# Patient Record
Sex: Male | Born: 1935 | Race: White | Hispanic: No | Marital: Married | Smoking: Former smoker
Health system: Southern US, Community
[De-identification: ages and names within clinical notes are randomized; demographics above are authoritative.]

## PROBLEM LIST (undated history)

## (undated) DIAGNOSIS — I719 Aortic aneurysm of unspecified site, without rupture: Secondary | ICD-10-CM

## (undated) DIAGNOSIS — I714 Abdominal aortic aneurysm, without rupture, unspecified: Secondary | ICD-10-CM

## (undated) DIAGNOSIS — F0390 Unspecified dementia without behavioral disturbance: Secondary | ICD-10-CM

## (undated) DIAGNOSIS — R05 Cough: Secondary | ICD-10-CM

## (undated) DIAGNOSIS — M1990 Unspecified osteoarthritis, unspecified site: Secondary | ICD-10-CM

## (undated) DIAGNOSIS — E611 Iron deficiency: Secondary | ICD-10-CM

## (undated) DIAGNOSIS — J939 Pneumothorax, unspecified: Secondary | ICD-10-CM

## (undated) DIAGNOSIS — J439 Emphysema, unspecified: Secondary | ICD-10-CM

## (undated) DIAGNOSIS — C801 Malignant (primary) neoplasm, unspecified: Secondary | ICD-10-CM

## (undated) DIAGNOSIS — Z9981 Dependence on supplemental oxygen: Secondary | ICD-10-CM

## (undated) DIAGNOSIS — H9190 Unspecified hearing loss, unspecified ear: Secondary | ICD-10-CM

## (undated) DIAGNOSIS — I739 Peripheral vascular disease, unspecified: Secondary | ICD-10-CM

## (undated) DIAGNOSIS — I499 Cardiac arrhythmia, unspecified: Secondary | ICD-10-CM

## (undated) DIAGNOSIS — E785 Hyperlipidemia, unspecified: Secondary | ICD-10-CM

## (undated) DIAGNOSIS — R059 Cough, unspecified: Secondary | ICD-10-CM

## (undated) DIAGNOSIS — K219 Gastro-esophageal reflux disease without esophagitis: Secondary | ICD-10-CM

## (undated) DIAGNOSIS — I2510 Atherosclerotic heart disease of native coronary artery without angina pectoris: Secondary | ICD-10-CM

## (undated) DIAGNOSIS — J449 Chronic obstructive pulmonary disease, unspecified: Secondary | ICD-10-CM

## (undated) DIAGNOSIS — E119 Type 2 diabetes mellitus without complications: Secondary | ICD-10-CM

## (undated) DIAGNOSIS — R0602 Shortness of breath: Secondary | ICD-10-CM

---
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
## 1967-03-03 HISTORY — PX: OTHER SURGICAL HISTORY: SHX169

## 1982-03-02 HISTORY — PX: OTHER SURGICAL HISTORY: SHX169

## 2000-01-28 ENCOUNTER — Other Ambulatory Visit: Admission: RE | Admit: 2000-01-28 | Discharge: 2000-01-28 | Payer: Self-pay | Admitting: Urology

## 2000-01-28 ENCOUNTER — Encounter (INDEPENDENT_AMBULATORY_CARE_PROVIDER_SITE_OTHER): Payer: Self-pay | Admitting: Specialist

## 2000-02-26 ENCOUNTER — Encounter (INDEPENDENT_AMBULATORY_CARE_PROVIDER_SITE_OTHER): Payer: Self-pay

## 2000-02-26 ENCOUNTER — Ambulatory Visit (HOSPITAL_COMMUNITY): Admission: RE | Admit: 2000-02-26 | Discharge: 2000-02-26 | Payer: Self-pay | Admitting: *Deleted

## 2002-04-14 ENCOUNTER — Encounter (INDEPENDENT_AMBULATORY_CARE_PROVIDER_SITE_OTHER): Payer: Self-pay | Admitting: Specialist

## 2002-04-14 ENCOUNTER — Ambulatory Visit (HOSPITAL_COMMUNITY): Admission: RE | Admit: 2002-04-14 | Discharge: 2002-04-14 | Payer: Self-pay | Admitting: *Deleted

## 2003-08-02 ENCOUNTER — Ambulatory Visit (HOSPITAL_COMMUNITY): Admission: RE | Admit: 2003-08-02 | Discharge: 2003-08-02 | Payer: Self-pay | Admitting: *Deleted

## 2003-08-02 ENCOUNTER — Encounter (INDEPENDENT_AMBULATORY_CARE_PROVIDER_SITE_OTHER): Payer: Self-pay | Admitting: *Deleted

## 2004-05-21 ENCOUNTER — Inpatient Hospital Stay (HOSPITAL_COMMUNITY): Admission: EM | Admit: 2004-05-21 | Discharge: 2004-05-23 | Payer: Self-pay | Admitting: Emergency Medicine

## 2004-05-22 ENCOUNTER — Encounter (INDEPENDENT_AMBULATORY_CARE_PROVIDER_SITE_OTHER): Payer: Self-pay | Admitting: Cardiovascular Disease

## 2004-07-20 ENCOUNTER — Inpatient Hospital Stay (HOSPITAL_COMMUNITY): Admission: EM | Admit: 2004-07-20 | Discharge: 2004-07-23 | Payer: Self-pay | Admitting: *Deleted

## 2005-03-02 HISTORY — PX: PROSTATECTOMY: SHX69

## 2005-10-19 ENCOUNTER — Encounter (INDEPENDENT_AMBULATORY_CARE_PROVIDER_SITE_OTHER): Payer: Self-pay | Admitting: Specialist

## 2005-10-20 ENCOUNTER — Ambulatory Visit (HOSPITAL_COMMUNITY): Admission: RE | Admit: 2005-10-20 | Discharge: 2005-10-20 | Payer: Self-pay | Admitting: *Deleted

## 2006-07-13 ENCOUNTER — Ambulatory Visit (HOSPITAL_COMMUNITY): Admission: RE | Admit: 2006-07-13 | Discharge: 2006-07-13 | Payer: Self-pay | Admitting: Internal Medicine

## 2006-11-23 ENCOUNTER — Encounter (INDEPENDENT_AMBULATORY_CARE_PROVIDER_SITE_OTHER): Payer: Self-pay | Admitting: *Deleted

## 2006-11-23 ENCOUNTER — Ambulatory Visit (HOSPITAL_COMMUNITY): Admission: RE | Admit: 2006-11-23 | Discharge: 2006-11-23 | Payer: Self-pay | Admitting: *Deleted

## 2006-11-24 ENCOUNTER — Ambulatory Visit: Payer: Self-pay | Admitting: Internal Medicine

## 2006-12-06 ENCOUNTER — Ambulatory Visit: Payer: Self-pay

## 2006-12-30 DIAGNOSIS — E785 Hyperlipidemia, unspecified: Secondary | ICD-10-CM

## 2006-12-30 DIAGNOSIS — Z87898 Personal history of other specified conditions: Secondary | ICD-10-CM

## 2006-12-30 DIAGNOSIS — R000 Tachycardia, unspecified: Secondary | ICD-10-CM

## 2006-12-30 DIAGNOSIS — J329 Chronic sinusitis, unspecified: Secondary | ICD-10-CM | POA: Insufficient documentation

## 2007-02-22 ENCOUNTER — Ambulatory Visit: Payer: Self-pay | Admitting: Internal Medicine

## 2007-03-24 ENCOUNTER — Ambulatory Visit: Payer: Self-pay | Admitting: Internal Medicine

## 2007-03-24 DIAGNOSIS — J441 Chronic obstructive pulmonary disease with (acute) exacerbation: Secondary | ICD-10-CM

## 2007-03-24 LAB — CONVERTED CEMR LAB: Digitoxin Lvl: 0.5 ng/mL — ABNORMAL LOW (ref 0.8–2.0)

## 2007-03-25 ENCOUNTER — Telehealth: Payer: Self-pay | Admitting: Internal Medicine

## 2007-04-06 ENCOUNTER — Telehealth: Payer: Self-pay | Admitting: Internal Medicine

## 2007-04-08 ENCOUNTER — Encounter: Payer: Self-pay | Admitting: Internal Medicine

## 2007-05-03 ENCOUNTER — Encounter: Payer: Self-pay | Admitting: Internal Medicine

## 2007-05-11 ENCOUNTER — Encounter: Payer: Self-pay | Admitting: Internal Medicine

## 2007-06-09 ENCOUNTER — Telehealth: Payer: Self-pay | Admitting: Internal Medicine

## 2007-06-23 ENCOUNTER — Ambulatory Visit: Payer: Self-pay | Admitting: Internal Medicine

## 2007-06-23 DIAGNOSIS — J984 Other disorders of lung: Secondary | ICD-10-CM | POA: Insufficient documentation

## 2007-06-23 DIAGNOSIS — J449 Chronic obstructive pulmonary disease, unspecified: Secondary | ICD-10-CM | POA: Insufficient documentation

## 2007-06-23 DIAGNOSIS — R1032 Left lower quadrant pain: Secondary | ICD-10-CM

## 2007-06-23 DIAGNOSIS — J439 Emphysema, unspecified: Secondary | ICD-10-CM

## 2007-06-23 DIAGNOSIS — J4489 Other specified chronic obstructive pulmonary disease: Secondary | ICD-10-CM | POA: Insufficient documentation

## 2007-07-08 ENCOUNTER — Telehealth (INDEPENDENT_AMBULATORY_CARE_PROVIDER_SITE_OTHER): Payer: Self-pay | Admitting: *Deleted

## 2007-07-20 ENCOUNTER — Encounter: Payer: Self-pay | Admitting: Internal Medicine

## 2007-07-22 ENCOUNTER — Encounter: Payer: Self-pay | Admitting: Internal Medicine

## 2007-07-29 ENCOUNTER — Telehealth (INDEPENDENT_AMBULATORY_CARE_PROVIDER_SITE_OTHER): Payer: Self-pay | Admitting: *Deleted

## 2007-08-03 ENCOUNTER — Telehealth (INDEPENDENT_AMBULATORY_CARE_PROVIDER_SITE_OTHER): Payer: Self-pay | Admitting: *Deleted

## 2007-08-03 ENCOUNTER — Ambulatory Visit: Payer: Self-pay | Admitting: Thoracic Surgery

## 2007-08-25 ENCOUNTER — Encounter: Payer: Self-pay | Admitting: Internal Medicine

## 2007-09-12 ENCOUNTER — Encounter: Payer: Self-pay | Admitting: Internal Medicine

## 2007-12-05 ENCOUNTER — Telehealth: Payer: Self-pay | Admitting: Internal Medicine

## 2009-03-02 DIAGNOSIS — C801 Malignant (primary) neoplasm, unspecified: Secondary | ICD-10-CM

## 2009-03-02 HISTORY — DX: Malignant (primary) neoplasm, unspecified: C80.1

## 2010-02-07 ENCOUNTER — Ambulatory Visit (HOSPITAL_COMMUNITY)
Admission: RE | Admit: 2010-02-07 | Discharge: 2010-02-07 | Payer: Self-pay | Source: Home / Self Care | Attending: Urology | Admitting: Urology

## 2010-03-23 ENCOUNTER — Encounter: Payer: Self-pay | Admitting: Internal Medicine

## 2010-06-13 ENCOUNTER — Ambulatory Visit (HOSPITAL_BASED_OUTPATIENT_CLINIC_OR_DEPARTMENT_OTHER)
Admission: RE | Admit: 2010-06-13 | Discharge: 2010-06-13 | Disposition: A | Discharge status: Home / Self Care | Payer: Medicare Other | Source: Ambulatory Visit | Attending: Urology | Admitting: Urology

## 2010-06-13 DIAGNOSIS — J4489 Other specified chronic obstructive pulmonary disease: Secondary | ICD-10-CM | POA: Insufficient documentation

## 2010-06-13 DIAGNOSIS — Z79899 Other long term (current) drug therapy: Secondary | ICD-10-CM | POA: Insufficient documentation

## 2010-06-13 DIAGNOSIS — Z01812 Encounter for preprocedural laboratory examination: Secondary | ICD-10-CM | POA: Insufficient documentation

## 2010-06-13 DIAGNOSIS — C61 Malignant neoplasm of prostate: Secondary | ICD-10-CM | POA: Insufficient documentation

## 2010-06-13 DIAGNOSIS — K219 Gastro-esophageal reflux disease without esophagitis: Secondary | ICD-10-CM | POA: Insufficient documentation

## 2010-06-13 DIAGNOSIS — J449 Chronic obstructive pulmonary disease, unspecified: Secondary | ICD-10-CM | POA: Insufficient documentation

## 2010-06-13 DIAGNOSIS — I10 Essential (primary) hypertension: Secondary | ICD-10-CM | POA: Insufficient documentation

## 2010-06-13 DIAGNOSIS — Z87891 Personal history of nicotine dependence: Secondary | ICD-10-CM | POA: Insufficient documentation

## 2010-06-19 NOTE — Op Note (Signed)
  NAME:  JACEY, PELC NO.:  [id number]  MEDICAL RECORD NO.:  [id number]          PATIENT TYPE:  OUT  LOCATION:  XRAY                         FACILITY:  [facility]  PHYSICIAN:  Maretta Bees. Vonita Moss, M.D.DATE OF BIRTH:  17-Mar-1928  DATE OF PROCEDURE:  06/13/2010 DATE OF DISCHARGE:  02/07/2010                              OPERATIVE REPORT   PREOPERATIVE DIAGNOSIS:  Prostatic carcinoma.  POSTOPERATIVE DIAGNOSIS:  Prostatic carcinoma.  PROCEDURE:  Cryoablation of prostate.  SURGEON:  Maretta Bees. Vonita Moss, M.D.  ANESTHESIA:  General.  INDICATIONS:  This 77 year old gentleman was found to have PSA of 9.52 and Gleason 7 carcinoma of the prostate.  He had a 57 g prostate and was given therapeutic options.  He elected cryotherapy and therefore we did downsizing with hormonal deprivation therapy.  Prostate came down to 33 g.  He was counseled about postoperative bleeding, retention or incontinence.  DESCRIPTION OF PROCEDURE:  The patient was brought to the operating room and placed in lithotomy position.  External genitalia and perineum prepped and draped in usual fashion.  Foley catheter was inserted, transrectal ultrasound probe was inserted.  Treatment planning was done with 2 needles in the top row, 2 needles in the second row, and 3 needles in the lower row.  He was cystoscoped and anterior urethra was normal.  He had small lateral lobes.  No obvious bladder lesions were seen.  A stiff guidewire was inserted and the urethral warming catheter inserted and seen to function fine on ultrasound.  He then underwent 2 freeze-thaw cycles with excellent results.  After urethral warming, a Foley catheter was reinserted and connected to close drainage and he was taken to recovery room in good condition having tolerated the procedure well.     Maretta Bees. Vonita Moss, M.D.     LJP/MEDQ  D:  06/13/2010  T:  06/13/2010  Job:  [id number]  Electronically Signed by Larey Dresser M.D.  on 06/19/2010 01:35:30 PM

## 2010-07-05 ENCOUNTER — Emergency Department (HOSPITAL_COMMUNITY)
Admission: EM | Admit: 2010-07-05 | Discharge: 2010-07-05 | Disposition: A | Discharge status: Home / Self Care | Payer: Medicare Other | Attending: Emergency Medicine | Admitting: Emergency Medicine

## 2010-07-05 DIAGNOSIS — N400 Enlarged prostate without lower urinary tract symptoms: Secondary | ICD-10-CM | POA: Insufficient documentation

## 2010-07-05 DIAGNOSIS — R339 Retention of urine, unspecified: Secondary | ICD-10-CM | POA: Insufficient documentation

## 2010-07-05 DIAGNOSIS — E7800 Pure hypercholesterolemia, unspecified: Secondary | ICD-10-CM | POA: Insufficient documentation

## 2010-07-15 NOTE — Assessment & Plan Note (Signed)
[facility]                             PULMONARY OFFICE NOTE   NAME:Fred Newton, Fred Newton                       MRN:          [id number]  DATE:11/24/2006                            DOB:          02-10-1929    CHIEF COMPLAINT:  Worsening shortness of breath for the past 6 months.   HISTORY OF PRESENT ILLNESS:  Fred Newton is a pleasant 77-year-  old male with chronic severe COPD.  He quit smoking in December, 2007.  After that he says he was doing great. However, since March of 2008  dyspnea on exertion has worsened progressively.  He feels as though he  hit a spot in March 2008 and since then has had a progressive  worsening of dyspnea which he describes as going down hill.  He denies  any specific COPD exacerbation or hospitalizations, emergency room  visits or any other change in symptoms at this particular time.  Overall  his sputum volume has decreased since he quit smoking, therefore he is  surprised by his worsening decline in dyspnea.  Currently he is dyspneic  from climbing one flight of stairs, and at the end of a shower.  He  thinks he can walk 3-4 blocks at a slow pace.   He denies any change in his chronic post nasal drip, any new chest pain,  orthopnea, nocturnal dyspnea, paroxysmal nocturnal dyspnea or  hemoptysis.  In terms of his sputum, in fact this is better, he brings  up occasional sputum which is gray or yellow in color.   PAST MEDICAL HISTORY:  1. Recent shingles in the left groin in May, 2008.  2. History of tachyarrhythmias not otherwise specified.  3. Negative cardiac stress-test in April of 2006.  4. Hyperlipidemia.  5. Chronic post nasal drip. Was on Claritin, currently on Zyrtec for      the last 6 months.  6. Negative CT angiogram for pulmonary embolism on Jul 21, 2004 at      [facility].  7. Left lower lobe nodule noted on CT scan on Jul 21, 2004 at [facility] but was negative on followup  on August, 2006 at      [facility].  8. Chronic obstructive pulmonary disease.  He was diagnosed with      emphysema in 1984 when he presented with pneumothorax.  He has had      no hospitalizations except one in May, 2006 for worsening dyspnea      when he stopped his Advair as part of a study.  He is not on any      home oxygen.  He denies any frequent exacerbations of      hospitalizations.  9. Exposed to tuberculosis when a child when his older sister spent      some time in a TB sanitarium.   PAST SURGICAL HISTORY:  1. Status post pleurodesis for a pneumothorax in 1984.  2. Bilateral [facility] Luc surgery for sinus polyps in 1969.   ALLERGIES:  No known drug allergies.   CURRENT  MEDICATIONS:  1. Fish oil 1000 mg b.i.d.  2. Calcium once daily.  3. Multivitamin once daily.  4. Aspirin 81 mg daily.  5. Wellbutrin once daily.  6. Prilosec 20 mg once daily.  7. Vitamin E 200 mg  once daily.  8. Digoxin 0.125 once daily.  9. Flomax 0.4 mg once daily.  10.Zocor 40 mg once daily.  11.Zyrtec once daily.  12.Vitamin C 500 mg once daily.  13.Advair 250/50 b.i.d.  14.Albuterol nebulizations as required.   SOCIAL HISTORY:  He lives with his wife.  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  He is  currently retired. He used to get exposed without aluminized fibers that  were intended for chafes. He says these fibers were very small and he  inhaled them.  He does not know if they have the potential to cause COPD  or lung disease because an OSHA visit to his site declared the facility  as okay.  He did smoke 1-1/2 packs a day x55 years but quit in  December, 2007 with the help of Chantix.  He has children.   FAMILY HISTORY:  His father had emphysema.  His mother had colon cancer  and sister has blood cancer.   REVIEW OF SYSTEMS:  Chronic post nasal drip for many years.  Was taking  Claritin, currently on Zyrtec.  Has leg  fatigue as well with exertion.  Also develops a sensation of fullness in the abdomen after eating.  Other than this, review of systems is positive for chronic dyspnea,  irregular heart beat as a younger person, some tooth problems, nasal  congestion and joint stiffness.  Rest of review of systems is negative  as documented in the questionnaire.   PHYSICAL EXAMINATION:  VITAL SIGNS:  Height 71 inches, weight 166.2  pounds. Temperature 97.8, blood pressure 120/76, pulse of 73, pulse  oximetry 97% on room air.  GENERAL:  Tall, thin male seated comfortably in the exam room.  CENTRAL NERVOUS SYSTEM:  Alert and oriented x3.  Speech and ambulation  are normal.  RESPIRATORY EXAM:  Air entry equal on both sides, prolonged expiration,  no specific wheeze.  CHEST EXAM:  Increased anteroposterior diameter.  HEENT EXAM:  Neck supple, no neck nodes, no elevated JVP.  CARDIOVASCULAR EXAM:  Normal heart sounds, regular rate and rhythm, no  murmurs, no gallops.  ABDOMEN:  Soft, no mass, no organomegaly, normal bowel sounds.  EXTREMITIES:  No clubbing, cyanosis, no pedal edema.  SKIN:  Is intact.  BACK EXAM:  No kyphosis, no scoliosis.   LABORATORY EVALUATION:  Cardiac stress-test done on June 05, 2004  Cardiolite at [facility] was negative according to patient.   CT scan of the chest done on Jul 21, 2004 at [facility] showed  no pulmonary embolism. There was a questionable nodule in the left mid  chest.  This was followed up at [facility] on October 17, 2004 with another CT scan with was reported as no new or persistent or  progressive nodules on the CT scan.  Both CT scans reported severe COPD  and bullous disease.  I personally have not viewed these films but have  looked at the reports.   Chest x-ray PA and lateral done on Jul 06, 2006 at [facility]  is reported to have severe COPD.  I have not viewed this film as well.   Spirometry and pulmonary  function tests.  First spirometry done on Jul 30, 2004 as part of a COPD study at [place].  FEV1 was 1.42  liters, 40% predicted.  FVC of 3.64 liters, 78% predicted and FE1, FVC  ratio of 39.  Another spirometry on April 10, 2004 shows an FE1 of 1.1  4 liters, 35% predicted.  Most recent spirometry was done at [facility] on Jul 13, 2006 around the time of his shingles in his groin.  His FE1 was 1.04 liters, 36 % predicted, FVC of 3.22 liters, 73%  predicted.  Therefore in 2 years he had an almost 400 cc age-and-smoking  related decline in absolute FEV1 but only a 4% decline in predicted  FEV1.   His laboratory evaluation on August 24, 2006:  Chemistries showed sodium  136, potassium 4.4, chloride 102, bicarb 24, BUN 25 and creatinine of  1.3.   Liver function tests were normal.   Today we performed a 6 minute walk test. Baseline blood pressure was  124/82, heart rate of 75, baseline dyspnea scale of 0.5, fatigue scale  of 0.5, pulse oximetry of 95%.  After 6 minutes he completed 400 meters.  At the end of the test his blood pressure was 130/84, pulse of 96,  dyspnea scale rose to 3, fatigue scale rose to 2, pulse oximetry dropped  to 87. He had no complaints at end of test   ASSESSMENT:  PROBLEM #1:  In summary, Fred Newton is a very pleasant 26-  year-old heavy smoker with gold stage III COPD.  He quite smoking in  December, 2007 but has had progressive dyspnea on exertion since March  of this year.  Correlating with this, is a loss of nearly 400 cc of FEV1  between May of 2006 and May of 2008.  On a 6 minute walk test, he  appeared quite functional covering 400 meters. Nothing in his history or  physical to suggest any other cause for worsening dyspnea other than age-  related decline in FEV1 and smoking related decline in FEV1.   PROBLEM #2:  Left lower lobe nodule.  The suspicion of left lower lobe  nodule in a CT scan of the chest in May of 2006 but was not reported  in  August, 2006.  The scans were done at different hospitals.  I have not  looked at these scans.  At his next visit I will personally look at  these scans and confirm absence of nodule.   PROBLEM #3:  Smoking.  He has quit smoking and I have congratulated him  on this fact.   PLAN:  1. I would still rule out anginal equivalent given high association of      CAD with COPD. I have recommended another nuclear stress test      becuase last one was 2 years ago   1. Start Spiriva daily inhaler for his chronic obstructive pulmonary      disease.   3 Flu shot today.  Inquire about pneumovax at next visit.   FOLLOW UP:  I will refer him to pulmonary rehabilitation and also check  alpha 1 antitrypsin level given family history of COPD.     Kalman Shan, MD  Electronically Signed    MR/MedQ  DD: 11/29/2006  DT: 11/29/2006  Job #: [id number]

## 2010-07-15 NOTE — Op Note (Signed)
NAME:  Fred Newton, PLESS NO.:  [id number]   MEDICAL RECORD NO.:  [id number]          PATIENT TYPE:  AMB   LOCATION:  ENDO                         FACILITY:  [facility]   PHYSICIAN:  Georgiana Spinner, M.D.    DATE OF BIRTH:  1929-01-21   DATE OF PROCEDURE:  11/23/2006  DATE OF DISCHARGE:                               OPERATIVE REPORT   PROCEDURE:  Upper endoscopy.   INDICATIONS:  Gastroesophageal reflux.   ANESTHESIA:  Fentanyl 75 mcg, Versed 5 mg.   DESCRIPTION OF PROCEDURE:  With the patient mildly sedated in the left  lateral decubitus position, the Pentax videoscopic endoscope was  inserted and passed under direct vision through the esophagus which  appeared normal until we reached distal esophagus and there were changes  of Barrett's esophagus photographed and biopsied.  We entered into the  stomach.  The fundus, body, antrum, duodenal bulb, and second portion of  the duodenum all appeared normal.  From this point, the endoscope was  slowly withdrawn taking circumferential views of the duodenal mucosa  until the endoscope had been pulled back into the stomach and placed in  retroflexion to view the stomach from below. The endoscope was  straightened and withdrawn taking circumferential views of the remaining  gastric and esophageal mucosa.  The patient's vital signs and pulse  oximeter remained stable.  The patient tolerated the procedure well  without apparent complication.   FINDINGS:  Barrett's esophagus biopsied, await biopsy report.  The  patient will call me for results and follow up with me as an outpatient  as necessary.           ______________________________  Georgiana Spinner, M.D.     GMO/MEDQ  D:  11/23/2006  T:  11/23/2006  Job:  [id number]   cc:   Lenon Curt. Chilton Si, M.D.  Fax: [phone]

## 2010-07-15 NOTE — Letter (Signed)
August 03, 2007   Fred Shan, MD  [phone] [address]  [place], [place]  [address]   Re:  Fred Newton, Fred Newton                DOB:  [birth year]   Dear Carmin Muskrat,   I appreciate the opportunity to see this gentleman, a 77 year old  Caucasian male, who has a long history of smoking and developed severe  emphysema.  In 1984, Dr Rica Mast in [place] did a bullectomy on the right.  He now comes in today for evaluation of a bulla on the left side.  The  CT scan shows a large left apical bulla, but there is no compression of  the remaining lung.  In fact, the remaining lung looks more like a  vanishing lung syndrome in that there is very little functional lung  available on the left side.  He does have some bullae on the right side,  but his lungs appeared to be better.  His FEV1 was 1.22 or 40% of  predicted.  He does have home oxygen and has a fairly decent performance  on a 6-minute walk test.  His medications include aspirin, digoxin 0.125  daily, Prilosec 20 mg a day, Zyrtec 10 mg day, Flomax 0.4 mg day,  simvastatin 40 mg a day, Advair Diskus 250/50 one puff twice a day, and  Proventil inhaler, Spiriva 18 mcg 2 puffs twice a day, and albuterol  inhaler.  He has had some previous prednisone and antibiotics.  On  occasions, he is taking Reglan and multiple vitamins.   ALLERGIES:  He is allergic to PREDNISONE, which causes him to be very  talkative and hyperactive.   He has been treated for hypercholesterolemia and chronic obstructive  pulmonary disease.  He quit smoking in 2007.   FAMILY HISTORY:  Noncontributory.   SOCIAL HISTORY:  He is retired, has 1 child.  Quit smoking in December  2007.  Does not drink alcohol on a regular basis.   REVIEW OF SYSTEMS:  He is 170 pounds.  He is 6 feet 1.  GENERAL:  There is no weight loss.  CARDIAC:  No angina.  He has shortness of breath with exertion, and he  has had some atrial arrhythmias.  No angina.  PULMONARY:  No hemoptysis.  GI:  No nausea,  vomiting, constipation, or diarrhea.  GU:  No dysuria or frequent urination or vascular disease, but does have  some hesitancy.  VASCULAR:  No claudication, DVT, or TIAs.  NEUROLOGICAL:  No dizziness, headaches, blackouts, or seizures.  MUSCULOSKELETAL:  No arthritis.  EYES/ENT:  He has cataracts.  No changes in hearing.  HEMATOLOGIC:  No problems with bleeding or clotting disorders or anemia.  He has no psychiatric illnesses.   PHYSICAL EXAMINATION:  His blood pressure is 118/78, pulse 78,  respirations 18, and O2 sat 94% on oxygen.  Head, Eyes, Ears, Nose, and  Throat:  Unremarkable.  Neck:  Supple without thyromegaly.  There is no  supraclavicular or axillary adenopathy.  Chest:  There is decreased  breath sounds and just clearly distant breath sounds on the left side,  but better breath sounds on the right side.  No wheezes.  He has a right  thoracotomy scar.  Heart:  Regular sinus rhythm.  No murmurs.  Abdomen:  Soft.  No hepatosplenomegaly.  Pulses are 2+.  There is no clubbing or  edema.   I have discussed situation with the patient, and I do not  believe he has  enough lung to warrant a bullectomy on the left side.  I think  his only  option would be to referral to [facility] for further evaluation for possible  lung transplantation.  I just do not feel there is enough viable lung to  undergo any type of surgery on the left side.  So, my recommendation  will be to send him down to the [facility] for another  opinion.  We are happy to see him again if I can be of any further help.   Ines Bloomer, M.D.  Electronically Signed   DPB/MEDQ  D:  08/03/2007  T:  08/04/2007  Job:  [id number]

## 2010-07-18 NOTE — Op Note (Signed)
NAME:  Fred Newton, Fred Newton NO.:  [id number]   MEDICAL RECORD NO.:  [id number]          PATIENT TYPE:  AMB   LOCATION:  ENDO                         FACILITY:  [facility]   PHYSICIAN:  Georgiana Spinner, M.D.    DATE OF BIRTH:  11-09-1928   DATE OF PROCEDURE:  10/20/2005  DATE OF DISCHARGE:                                 OPERATIVE REPORT   PROCEDURE:  Upper endoscopy.   INDICATIONS:  Barrett's esophagus.   ANESTHESIA:  Fentanyl 75 mcg and Versed 5 mg.   PROCEDURE:  With the patient was mildly sedated in the left lateral  decubitus position, the Olympus video endoscope was inserted into the mouth  and passed under direct vision through the esophagus.  It appeared normal  until we reached the distal esophagus and there were changes of Barrett's.  This was photographed and biopsied.  We entered into the stomach where  hiatal hernia, fundus, body, antrum, duodenal bulb and second portion of the  duodenum were visualized.  From this point the endoscope was slowly  withdrawn taking several views of the duodenum and mucosa until the  endoscope had been pulled back into the stomach, placed in retroflex and  viewed the stomach from below.  The endoscope was straightened and withdrawn  taking several views of the gastric and esophageal mucosa.  The patient's  vital signs on pulse oximeter remained stable.  The patient tolerated the  procedure well without any apparent complications.   FINDINGS:  1. Barrett's esophagus.  2. Hiatal hernia.   PLAN:  The patient will call me for results and follow up with me as an  outpatient.           ______________________________  Georgiana Spinner, M.D.     GMO/MEDQ  D:  10/20/2005  T:  10/20/2005  Job:  [id number]   cc:   Lenon Curt. Chilton Si, M.D.

## 2010-07-18 NOTE — H&P (Signed)
NAME:  Fred Newton, Fred Newton NO.:  [id number]   MEDICAL RECORD NO.:  [id number]          PATIENT TYPE:  INP   LOCATION:  1827                         FACILITY:  [facility]   PHYSICIAN:  Altha Harm, MDDATE OF BIRTH:  [birth year]   DATE OF ADMISSION:  07/20/2004  DATE OF DISCHARGE:                                HISTORY & PHYSICAL   CHIEF COMPLAINT:  Shortness of breath x 2 weeks, getting progressively worse  over the last 24 hours.   HISTORY OF PRESENT ILLNESS:  This is a 77 year old gentleman with a history  of COPD who presents today with worsening shortness of breath over the last  two weeks.  According to the patient, he was seen by his primary doctor, Dr.  Chilton Si, in the office last week and without any improvement in his symptoms.  He states that, over the last 24 hours, he has noted that he was extremely  short of breath and did not respond to albuterol MDIs.  The patient does not  have a nebulizer at home.  Incidentally, the patient has been in an acid  study at [facility] at the __________ clinic which has had the patient off  Advair for approximately a one-month period.  The patient states that he  does not short the study drug but states that since he has been off Advair,  he has been having shortness of breath intermittently.  The patient denies  any fever or chills, nausea, vomiting, or diarrhea.  His cough has been  nonproductive.  He denies any other associated symptoms.   PAST MEDICAL HISTORY:  1.  COPD.  2.  Tachyarrhythmia.  3.  Benign prostatic hypertrophy.   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  The patient states that he smoked a  pack per day for approximately 55 years; however, he has been cutting down  on cigarette smoking over the last month.  The patient denies any  alcohol or  drug use.  The patient notes that he has been using nicotine patches and  Nicoderm CQ as part of smoking cessation program.   ALLERGIES:  No known drug allergies.   CURRENT MEDICATIONS:  1.  Flomax 0.4 mg daily.  2.  Clarinex 10 mg daily.  3.  Aspirin 81 mg daily.  4.  Digoxin 0.125 mg daily.  5.  Vitamin C 500 mg daily.  6.  Vitamin E 200 international units daily.  7.  Albuterol MDI q.4h. p.r.n.  8.  The patient had been on Advair 250/50 mg but is off it right now.  9.  The patient also uses a nicotine patch 21 mg daily.   REVIEW OF SYSTEMS:  The patient denies any dizziness.  He denies any unusual  weight loss or weight gain.  He denies any heat or cold intolerance.  He  denies any fatigue.  He denies any loss of consciousness or seizure today.  The  patient denies any chest pain, any palpitations, any claudication.  The  patient denies any abdominal pain, constipation, diarrhea, or reflux  symptoms.  The patient denies any frequency, urgency, dysuria.  The patient  denies any joint pain and denies any focal weakness.  All other systems  negative except as noted in HPI.   LABORATORY DATA:  Laboratory studies in the ER showed a sodium of 134,  potassium 4.8, chloride 101, bicarb 30.4, BUN 9, creatinine 0.9.  Venous  blood gas showed a pH of 7.33, PCO2 of 56.4.  Digoxin level 0.7.  CBC showed  a white blood cell count of 10.3, hemoglobin 13.9, and platelets 296.   PHYSICAL EXAMINATION:  GENERAL:  The patient is resting comfortably in no  acute distress.  The patient is having coughing spells, but between those  spells, he is without any discharge.  The patient has no conversation  dyspnea.  VITAL SIGNS:  Temperature 97.8, blood pressure 137/88, heart rate 94,  respiratory rate 24.  The patient is 93% on room air; however, with  ambulation, O2 saturation dropped to 82%.  HEENT:  The patient is normocephalic and atraumatic.  Pupils equal, round,  and reactive to light  and accommodation.  Extraocular movements are intact.  Fundi benign.  Tympanic membranes are translucent bilaterally with good  landmarks.  Buccal mucosa is pale.  There are no polyps noted.  Oropharynx:  Mucosa is moist.  There are no exudates, erythema, or lesions.  NECK:  Supple.  Trachea is midline.  No masses, no thyromegaly noted.  There  was no JVD, no carotid bruits.  LUNGS:  The patient has no conversation dyspnea.  He does have some mild  nasal flaring; however, no other accessory muscle use.  Equal excursion  bilaterally.  Tympanic to auscultation.  There is no decreased vocal  fremitus.  There is mild diffuse wheezing.  No rales noted.  CARDIAC:  PMI is nondisplaced.  He has a normal S1 and S2.  No murmurs,  rubs, or gallops are noted.  There is no heaves or thrills with palpation.  ABDOMEN:  Soft, nontender, nondistended.  No masses and no  hepatosplenomegaly noted.  He has normoactive bowel sounds.  LYMPHADENOPATHY:  There is no cervical, inguinal, or axillary  lymphadenopathy noted.  MUSCULOSKELETAL:  The patient has full range of motion in joints of upper  and lower extremities.  Gait is within normal limits.  There is no warmth,  erythema, or swelling around the joints.  NEUROLOGIC:  The patient's cranial nerves II-XII grossly intact.  There is  no focal neurological deficits.  Sensation is intact to light touch and  pinprick, proprioception.  Strength is 4/5 bilateral upper and lower  extremities.  Psychologically, the patient has a normal affect, good  insight, good recent and remote recall.   ASSESSMENT AND PLAN:  A patient with acute exacerbation of chronic  obstructive pulmonary disease, most likely secondary to a viral process in  addition to the fact that he is off his control medication. We will bring  the patient in as an admission and try to wean the oxygen down to room air  respiration.  The patient will also be started on oral prednisone at a dose of 60 mg  p.o. daily and will give him albuterol and Atrovent treatments.  The patient will be continued on nicotine patch and will be restarted on  Advair 250/50 one puff p.o. per day.  I anticipate that the patient should  be  ready for discharge in approximately 72 hours.  In addition, the patient  will be started on Zithromax for atypical process.   In terms of tachyarrhythmia, the patient will be continued on digoxin at his  current dose.  Even though the medication is still subtherapeutic, the  patient appears to be having no symptoms.  Will need to increase the dosage  of digoxin.   For stroke prevention, the patient will be placed on aspirin.   For smoking cessation, the patient will be placed on nicotine 21 mg  transdermally.  The patient will get a regular diet.  The patient will be  given Mucinex for cough and expectoration.      MAM/MEDQ  D:  07/20/2004  T:  07/20/2004  Job:  [id number]

## 2010-07-18 NOTE — Consult Note (Signed)
NAME:  Fred Newton NO.:  [id number]   MEDICAL RECORD NO.:  [id number]          PATIENT TYPE:  INP   LOCATION:  1830                         FACILITY:  [facility]   PHYSICIAN:  Ulyses Amor, MD DATE OF BIRTH:  Mar 03, 1928   DATE OF CONSULTATION:  DATE OF DISCHARGE:                                   CONSULTATION   CONSULTING PHYSICIAN:  Ulyses Amor, M.D.   Fred Newton is a 77 year old white man who is admitted to [facility] for hypotension.  A cardiology consultation was requested in order  to assist in his management.   The patient initially presented to his primary care physician today  complaining of dizziness.  His wife took his blood pressure this morning and  said his systolic blood pressure ranged from 70 to 80.  His systolic blood  pressure usually in the range of 100.  His lightheadedness was worse in the  standing position.  He also noted a mild sharp pain in his left upper  anterior chest with deep inspiration.  The patient has a history of severe  chronic obstructive pulmonary disease.  Approximately one month ago he was  treated with antibiotics for a pneumonia.  He also received a prednisone  taper for an exacerbation of his COPD.  He reports no fever, chills, cough,  or sputum production, nor has he experienced any chest pain, tightness,  heaviness, pressure, or squeezing.  Upon presentation to the emergency room,  he was found to be in atrial fibrillation with a ventricular rate of 150  beats per minute.  His systolic blood pressure was approximately 70.  Diltiazem was started.  He soon converted to a normal sinus rhythm in the  range of 70-80.  His blood pressure has remained in the 80s.  He remained  alert and __________  well.  The patient has no past history of cardiac  disease including no history of chest pain, myocardial infarction, coronary  artery disease, or congestive heart failure.  The patient continues to  smoke.   He also has a history of dyslipidemia.  There is no history of  hypertension, diabetes mellitus, or family history of coronary artery  disease.   The patient has a number of other medical problems:  1.  He has a past history of colon cancer.  2.  There is a history of benign prostatic hypertrophy.  3.  Diverticulosis.  4.  Dupuytren contractures.  5.  Lumbar spondylosis.  6.  Barrett esophagus.   PREVIOUS OPERATIONS:  1.  Right thoracotomy with __________  resection in 1984.  2.  Eyelid surgery in 2005.  3.  Vasectomy in 1969.   MEDICATIONS:  Advair, albuterol, Claritin, Prilosec, QVAR, Flomax.   ALLERGIES:  None.   The patient is retired.  He lives with his wife.  He smokes as noted above.  He does not drink.   REVIEW OF SYSTEMS:  Reveals no problems related to his head, eyes, ears,  nose, mouth, throat, lungs, gastrointestinal system, genitourinary system,  or extremities.  There is no history of neurologic  or psychiatric disorder.  There is no history of fever, chills, or weight loss.   PHYSICAL EXAMINATION:  VITAL SIGNS:  Blood pressure 96/75, pulse 87 and  regular, respirations 20, temperature 97.0.  GENERAL:  The patient was an elderly white man in no discomfort.  He was  alert, oriented, and appropriate.  HEENT:  Head, eyes, nose, and mouth were normal.  NECK:  Without thyromegaly or adenopathy.  Carotid pulses were palpable  bilaterally and without bruits.  CARDIAC:  Revealed a normal S1 and S2.  There was no S3, S4, murmur, rub, or  click.  Cardiac rhythm is regular.  No chest wall tenderness was noted.  LUNGS:  Clear.  ABDOMEN:  Without hepatosplenomegaly, tenderness, bruit, distention,  rebound, guarding, or rigidity.  Bowel sounds were normal.  RECTAL:  Not performed, not pertinent to the reason for acute care  hospitalization.  GENITAL:  Not performed, not pertinent to the reason for acute care  hospitalization.  EXTREMITIES:  Without edema, deviation, or  deformity.  Radial and dorsalis  pedal pulses were palpable bilaterally.  NEUROLOGIC:  Brief screening neurologic survey was unremarkable.   A chest radiograph reportedly demonstrated a 12 x 10-cm left upper lobe  bullous and a 7 x 5-cm left lower lobe bullous.  There was a peribronchial  infiltrate consistent with pneumonia.  The initial electrocardiogram  demonstrated atrial flutter.  The post conversion electrocardiogram was  normal with the exception of right atrial enlargement.  The white count was  12.7 with a hemoglobin of 14.0, and hematocrit of 39.0.  Potassium is 4.6,  BUN 17, creatinine 1.7.  Pulse ox was 98% on room air.  The remaining  studies were pending at the time of this dictation.   IMPRESSION:  1.  Hypertension.  This is probably due to volume depletion, would rule out      sepsis.  The patient's blood pressure usually runs in the range of 100,      so his blood pressure now of 85 to 95 is not markedly depressed.  Doubt      that there is a cardiac etiology for the patient's hypotension.  2.  Atrial flutter, resolved.  He had no history of cardiac disease and      there was no chest pain suggestive of cardiac ischemia.  His post      conversion electrocardiogram is normal with the exception of right      atrial enlargement.   RECOMMENDATIONS:  1.  Cardiac step down unit.  2.  Serial cardiac enzymes.  3.  Echocardiogram.  4.  Intravenous fluids.  5.  Blood cultures.  6.  Sepsis antibiotic coverage.  7.  Pulmonary consult.  8.  Dr. Allyson Sabal will follow with you beginning in the morning.      MSC/MEDQ  D:  05/21/2004  T:  05/22/2004  Job:  [id number]   cc:   Ulyses Amor, MD  [address]  [place], [place] [address]  Fax: [phone]   Nanetta Batty, M.D.  Fax: [phone]

## 2010-07-18 NOTE — Discharge Summary (Signed)
NAME:  Fred Newton, Fred Newton NO.:  [id number]   MEDICAL RECORD NO.:  [id number]          PATIENT TYPE:  INP   LOCATION:  3705                         FACILITY:  [facility]   PHYSICIAN:  Everett Graff, N.P.  DATE OF BIRTH:  [birth year]   DATE OF ADMISSION:  05/21/2004  DATE OF DISCHARGE:  05/24/2004                                 DISCHARGE SUMMARY   CHIEF COMPLAINT:  Feeling faint.   DISCHARGE DIAGNOSES:  1.  Dizziness and faintness, presenting complaint.  Thought likely      multifactorial due to hypotension from mild dehydration and atrial      flutter.  All symptomatic complaints resolved.      1.  ? component of addisonian crisis post stress dose steroids described          above.      2.  Cardiology consult workup by [facility] and Vascular as          described below.      3.  A two-view chest x-ray on May 23, 2004, notes severe bullous          emphysema, but no active disease.  2.  Atrial flutter and hypotension.  Again thought likely multifactorial due      to dehydration plus or minus addisonian crisis.      1.  Cardiac enzymes x 3 sets essentially unremarkable other than a          mildly elevated troponin as outlined below.      2.  Cardiology consult by Dr. Effie Shy and then Dr. Clarene Duke of          [facility] and Vascular felt this presentation did not          represent an acute cardiac event with recommended outpatient          Cardiolite study to be performed at the [facility] and          Vascular office per appointment on June 05, 2004, at 10:30 a.m.          Also, followup appointment with Dr. Clarene Duke on May 16, 2004, at          3:30 p.m.      3.  2-D echocardiogram done on May 22, 2004, notes overall left          ventricular systolic function normal with ejection fraction          estimated at 55-65%.  No evidence of left ventricular regional wall          motion abnormalities.  Aortic valve thickness moderately  increased.          Low-normal aortic valve leaflet excursion.  Poor acoustic window          with a difficult assessment of aortic valve.  Thickness in the          region of the left and right cusps.  Does not appear hemodynamically          significant for aortic stenosis, but consider TEE for further  evaluation if felt clinically indicated.  Flat mitral valve closure          without definitive evidence of mitral valve prolapse.      4.  Per post admission telemetry, the patient's normal sinus rhythm with          rate 50-90 range.  Presenting 12-lead EKG did note atrial flutter at          a rate of 150 per minute.      5.  Digoxin 0.25 mg daily initiated per cardiology.  Requests followup          serum digoxin level next office visit at [facility] in one          to two weeks.  3.  Question addisonian crisis.      1.  Mild hypotension, tachycardia, hyponatremia and high-normal serum          potassium all suggestive of possible addisonian crisis.      2.  The patient's random serum cortisol level was 4.8.  Serum TSH 0.7-          0.8.      3.  Status post IV steroid stress dose with conversion at discharge to          oral prednisone with rapid taper to discontinuance post discharge.  4.  Pleuritic chest pain.      1.  Chest x-ray again indicated bullous emphysema with no active          disease.      2.  The plan was for CT scan to rule out pulmonary emboli, although the          patient is not currently reporting chest pain and is requesting          discharge.  CT scan deferred.  5.  Dehydration with hyponatremia.      1.  Baseline BUN and creatinine about 15 and 1.0, respectively.  On          admission, low sodium at 129, BUN 17 and creatinine 1.7.      2.  At discharge, sodium remains low at 127.  Request recheck of basic          metabolic panel at [facility] office on May 26, 2004, to          follow up.  6.  Normocytic anemia.  Hemoglobin  admission level 14.0 and at discharge      10.6.      1.  No sign of acute bleed.  Felt likely dilutional component secondary          to IV fluids.      2.  Anemia not worked up over the course of this hospitalization.  A          repeat CBC at [facility] office on May 26, 2004, and          follow up of anemia as felt appropriate post discharge.      3.  Previous history of diverticulosis, external hemorrhoids and          Barrett's esophagus.  7.  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      1.  Respiratory status stable over the course of hospitalization with          discontinuing short-term antibiotic therapy, Rocephin and Avelox  prior to discharge.      2.  For office followup at [facility] one to two weeks post          discharge.  8.  History of colon cancer felt in remission.  9.  History of hyperlipidemia.  10. History of benign prostatic hypertrophy with elevated prostatic specific      antigen followed by Dr. Vonita Moss, urology.  11. History of tinnitus.  12. History of diffuse lumbar spondylosis.  13. Surgical history includes right thoracotomy in 1984, eyelid surgery in      April of 2005 and vasectomy in 1969.   DISCHARGE MEDICATIONS:  1.  Albuterol metered dose inhaler p.r.n. as previous.  2.  Claritin D 5/240 mg daily.  3.  Enteric-coated aspirin 81 mg daily.  4.  Multivitamins daily.  5.  Vitamin C 500 mg daily.  6.  Vitamin E one tablet daily.  7.  Prilosec 40 mg daily.  8.  Viagra 100 mg as needed as previous.  9.  Qvar 80 mcg inhaler as directed per pharmacologic research study.  10. Flomax 0.4 mg daily.  11. Digoxin 0.25 mg daily (new medication).  12. Guaifenesin 600 mg two tablets every 12 hours.  Dosed to last two weeks'      duration post discharge.  13. Prednisone taper 60 mg daily for two days, then 40 mg daily for four     days, then 20 mg daily for two days, then 10  mg daily for two days and      then discontinue.  14. Tylenol 650 mg every four hours as needed for mild pain or fever.   SPECIAL DISCHARGE INSTRUCTIONS:  1.  Disposition:  Discharged home where the patient lives independently with      his wife.  2.  Activity:  Up as tolerated without restriction.  3.  Discharge diet:  Regular without restriction.  4.  Follow up at [facility] and Vascular for Cardiolite study on      June 05, 2004, at 10:30 a.m.  5.  Follow up with Dr. Clarene Duke at [facility] and Vascular on June 16, 2004, at 3:30 p.m.  6.  Followup laboratory work at [facility] on May 26, 2004, for basic metabolic panel and CBC without differential.  7.  Followup appointment at [facility] office in one to two weeks.      Call [phone] for an appointment.  8.  During the next office visit, the patient should have a check of his      digoxin level.   CONSULTS OVER THE COURSE OF HOSPITALIZATION:  [facility] and  V[facility].   PROCEDURES OVER THE COURSE OF HOSPITALIZATION:  A 2-D echocardiogram on  May 22, 2004, with results as above.   DISCHARGE LABORATORIES:  A basic metabolic panel on May 23, 2004, found  sodium 127, potassium 4.6, chloride 100, CO2 24, BUN 15 and creatinine 1.0.  Liver function normal.  Albumin low at 2.7.  CBC on May 23, 2004, found  WBC elevated at 11.7 on stress dose steroids, hemoglobin 10.6, hematocrit  29.7 and platelets 234.  Cardiac enzymes x 3 sets essentially unremarkable,  except for mildly elevated troponin ranging 0.29-0.13.  TSH normal a 0.708.  Cortisol random 4.8.  Urine unremarkable.  Blood cultures x 2 with no  growth.  Chest x-ray on May 23, 2004, noted severe bullous emphysema with  no active disease.   HISTORY OF PRESENT ILLNESS:  This 77 year old male lives at home  independently.  He is followed as an outpatient by [facility].  He  has a known history of  significant COPD.  He reported feeling faint and  dizzy for one day's time.  His wife checked his blood pressure at home and  it was apparently 70-80/44.  He apparently is involved in an inhaled steroid  pharmacologic study.  Per visit there, he was noted to have respiratory  congestion and was sent for chest x-ray.  He was started on a 10-day course  of Avelox and a prednisone taper.  He had finished the antibiotics on May 16, 2004.  He continued with a productive cough and purulent sputum.  He  felt chills and some chest tightness, but no fever.  He had decreased  appetite and I suspect his oral fluid intake was reduced.  He experienced a  syncopal episode and described some pleuritic chest pain.  He came to the  [facility] for further evaluation and treatment.  A 12-lead  EKG indicated atrial flutter at a rate of 150 per minute.  The patient was  admitted for further evaluation and treatment.  For past medical history, past surgical history, admission medications, family history, social  history, review of systems, physical exam, laboratory data, impression and  plan, see the dictated history and physical dated May 22, 2004.   HOSPITAL COURSE:  #1 - COMPLAINTS OF DIZZINESS AND FAINTNESS ON ADMISSION:  The patient presented as per history of present illness.  He clinically and  metabolically appeared dehydrated with BUN and creatinine as above his  normal baseline.  He was hyponatremic and hypotensive.  He was initiated on  IV hydration with improvement of his blood pressure.  Ultimately his  dizziness/faintness was thought multifactorial due to his atrial flutter and  hypotension secondary to dehydration plus or minus addisonian crisis.  All  of these is discussed below.   #2 - ATRIAL FLUTTER AND HYPOTENSION:  The patient again presented as per  history of present illness.  The 12-lead EKG noted atrial flutter with a  rate of 150.  A coronary artery disease consult  was obtained with  [facility] and Vascular.  Three sets of cardiac enzymes were  obtained and these were essentially unremarkable, except for mildly elevated  tropon in the 0.13-0.29 range.  A 2-D echocardiogram was obtained on May 22, 2004, and this indicated normal left ventricular systolic function with  an ejection fraction of 55-65%.  There was no indication of regional wall  motion abnormalities.  Difficult Doppler study for assessment of aortic  valve stenosis, but no obvious indication.  Recommend TEE for further  evaluation.  No obvious indication of mitral valve prolapse.  Ultimately,  cardiology felt this was not due to an acute cardiac event.  The patient was  placed on digoxin 0.25 mg daily.  He is to follow up as an outpatient for  Cardiolite study on June 05, 2004, at 10:30 a.m. and then an appointment  with Dr. Clarene Duke at [facility] and Vascular on June 16, 2004, at  3:30 p.m.  Will need a followup digoxin level at [facility] office  at the next office visit in one to two weeks.  Post admission with hydration  and digoxin, rate has been well controlled per telemetry normal sinus rhythm  at 60-90 range.   #3 - QUESTION EARLY  ADDISONIAN CRISIS:  The patient had received a course of  prednisone prior to hospitalization.  He presented hypotensive, complaints  of weakness, hyponatremic with a sodium of 129 and borderline elevated  potassium at 4.6-4.9.  Suspected possible addisonian crisis and the patient  was stress dosed with IV steroids.  His blood pressures and symptomatology  have resolved.  At discharge, I will place the patient on oral prednisone  with rapid taper to discontinuance post discharge.  He remains hyponatremic  at 127 at the time of discharge.  Will have a followup basic metabolic panel  at [facility] office on May 26, 2004.   #4 - HYPOTENSION:  The patient again presented hypotensive per family report, systolic blood  pressure in the 70-80 range.  Per emergency room  evaluation, blood pressures in the 75-97 systolic range.  This thought most  likely due to a degree of dehydration.  BUN and creatinine were elevated  above baseline at the time of admission and the patient was hyponatremic.  He admits to having anorexia and taking less than normal intake.  With IV  hydration, his blood pressures have normalized.  Question if early  addisonian crisis might have been contributory.  Nevertheless, blood  pressures have been stable at discharge at 114/70.   #5 - PLEURITIC CHEST PAIN:  On admission, the patient complained of some  pleuritic chest pain.  Chest x-ray was done and this indicted bullous  emphysema, but no active disease.  The plan was for CT scan of the chest to  follow up for possible emboli when creatinine improved.  At this time, the  patient has no further complaints of chest pain.  He wishes to defer CT scan  and be discharged today.   #6 - DEHYDRATION WITH HYPONATREMIA:  The patient presented with sodium 129,  BUN 17 and creatinine 1.7.  I believe his baseline BUN and creatinine are  about 15 and 1.0, respectively.  With IV hydration, his renal function  normalized.  His sodium remains low at 127.  The patient is taking oral  fluids adequately.  He is to follow up with the [facility] office  on May 26, 2004, for a recheck of beats per minute.   #7 - NORMOCYTIC ANEMIA:  The patient presented with a hemoglobin of 14.0.  With IV hydration, this declined to a discharge level of 1.06.  There is  believed to be a component of hemodilution here.  There has been no sign of  acute bleed.  Anemia not further worked up over the course of this  hospitalization.  The patient should have a repeat CBC done at the [facility] office on May 26, 2004.  If anemia appears progressive, would  certainly consider stool occult bloods and anemia panel to follow this post  discharge.   #8 -  SEVERE CHRONIC OBSTRUCTIVE PULMONARY DISEASE:  This is a known history.  The patient's x-ray on May 23, 2004, indicates bullous emphysema with no  acute process.  Mr. Brouillet did have a respiratory infection prior to  hospitalization and received a full course of 10 days of Avelox.  This was  completed on May 16, 2004.  The patient was empirically started on  Rocephin and azithromycin IV antibiotics at the time of admission.  There  was no sign of active infection.  The patient does have some mild  leukocytosis at discharge, 11.7, but this was felt likely due to stress dose  steroids.  He is afebrile and his respiratory status is stable.  At  discharge, I am discontinuing his IV antibiotics with Rocephin and  azithromycin.  The patient should have a followup at [facility]  office in one to two weeks.  The patient is to call [phone] for an  appointment.   #9 - CODE STATUS:  The patient is currently full code.  DISPOSITION:  Discharged home where the patient lives independently.   ACTIVITY:  Unrestricted as tolerated.   DIET:  Regular as tolerated.   CONDITION AT THE TIME OF DISCHARGE:  Stable/improved, though frail with  multiple medical problems as above.   DISCHARGE PROCESS:  Greater than 30 minutes, 12 noon through 1 p.m.      TC/MEDQ  D:  05/23/2004  T:  05/24/2004  Job:  [id number]   cc:   Thereasa Solo. Little, M.D.  [address]  [address]  [place]  [place] [address]  Fax: [phone]

## 2010-07-18 NOTE — Op Note (Signed)
NAME:  Fred Newton, Fred Newton NO.:  [id number]   MEDICAL RECORD NO.:  [id number]          PATIENT TYPE:  AMB   LOCATION:  ENDO                         FACILITY:  [facility]   PHYSICIAN:  Georgiana Spinner, M.D.    DATE OF BIRTH:  05/14/1928   DATE OF PROCEDURE:  10/20/2005  DATE OF DISCHARGE:                                 OPERATIVE REPORT   PROCEDURE:  Colonoscopy.   INDICATIONS:  Family history of colon polyps.   ANESTHESIA:  Fentanyl 25 mcg, Versed 2 mg.   PROCEDURE:  With the patient mildly sedated in the left lateral decubitus  position, a rectal examination was performed.  Prostate felt slightly  enlarged but symmetrical without nodules.  Subsequently the Olympus  videoscopic colonoscope was inserted into the rectum and passed under direct  vision to the cecum identified by ileocecal valve and appendiceal orifice  both of which were photographed.  From this point colonoscope was slowly  withdrawn, taking circumferential views of the colonic mucosa stopping only  in the rectum which appeared normal on direct and showed hemorrhoids on  retroflexed view.  The endoscope was straightened and withdrawn.  The  patient's vital signs, pulse oximeter remained stable.  The patient  tolerated procedure well without apparent complications.   FINDINGS:  Internal hemorrhoids otherwise unremarkable colonoscopic  examination to the cecum.   PLAN:  Because of family history consider repeat examination possibly in 5  years           ______________________________  Georgiana Spinner, M.D.     GMO/MEDQ  D:  10/20/2005  T:  10/20/2005  Job:  [id number]   cc:   Lenon Curt Chilton Si, M.D.

## 2010-07-18 NOTE — Op Note (Signed)
NAME:  Fred Newton, Fred Newton NO.:  [id number]   MEDICAL RECORD NO.:  [id number]                   PATIENT TYPE:  AMB   LOCATION:  ENDO                                 FACILITY:  [facility]   PHYSICIAN:  Georgiana Spinner, M.D.                 DATE OF BIRTH:  1928/04/08   DATE OF PROCEDURE:  08/02/2003  DATE OF DISCHARGE:                                 OPERATIVE REPORT   PROCEDURE PERFORMED:  Upper endoscopy with biopsy.   ENDOSCOPIST:  Georgiana Spinner, M.D.   INDICATIONS FOR PROCEDURE:  Barrett's esophagus.   ANESTHESIA:  Demerol 60 mg, Versed 6 mg.   DESCRIPTION OF PROCEDURE:  With the patient mildly sedated in the left  lateral decubitus position, the Olympus video endoscope was inserted in the  mouth and passed under direct vision through the esophagus which appeared  normal until we reached the distal esophagus.  Then there was an area of  Barrett's seen, photographed and biopsied.  We entered into the stomach  through a hiatal hernia.  The fundus, body, antrum, duodenal bulb and second  portion of the duodenum all appeared normal.  From this point, the endoscope  was slowly withdrawn taking circumferential views of the duodenal mucosa  until the endoscope was pulled back into the stomach and placed on  retroflexion to view the stomach from below.  The endoscope was then  straightened and withdrawn taking circumferential views of the remaining  gastric and esophageal mucosa.  The patient's vital signs and pulse oximeter  remained stable.  The patient tolerated the procedure well without apparent  complications.   FINDINGS:  Barrett's esophagus.  Otherwise unremarkable examination.   PLAN:  Have patient follow up with me as an outpatient.  Continued Prilosec  40 mg daily as previously written.                                               Georgiana Spinner, M.D.    GMO/MEDQ  D:  08/02/2003  T:  08/02/2003  Job:  [id number]

## 2010-07-18 NOTE — Procedures (Signed)
[facility]  Patient:    Fred Newton, Fred Newton                   MRN: [id number] Proc. Date: 02/26/00 Adm. Date:  [id number] Attending:  Sabino Gasser                           Procedure Report  PROCEDURE:  Upper Endoscopy.  GASTROENTEROLOGIST:  Sabino Gasser, M.D.  INDICATIONS:  Hemoccult positivity.  ANESTHESIA:  Demerol 10 mg, Versed 1 mg additionally.  PROCEDURE IN DETAIL:  With the patient mildly sedated and in the left lateral decubitus position, the Olympus video endoscope was inserted in the mouth, passed under direct vision through the esophagus which appeared normal except for a question of distal Barretts esophagus, photographed and biopsied. We entered into the stomach, fundus, body, antrum, all appeared normal as did the duodenal bulb, second portion of duodenum.  From this point, the endoscope was slowly withdrawn, taking circumferential views of entire duodenal mucosa until the endoscope had been pulled back into the stomach, placed in retroflexion to view the stomach from below, and a hiatal hernia was seen. The endoscope was then straightened and withdrawn taking circumferential views of remaining gastric and esophageal mucosa.  The patients vital signs and pulse oximetry remained stable.  The patient tolerated the procedure well with no apparent complications.  FINDINGS:  Question of Barretts esophagus as well as hiatal hernia. Await biopsy report.  PLAN:  The patient will call me for results and follow up with me as an outpatient. DD:  02/26/00 TD:  02/26/00 Job: [id number]

## 2010-07-18 NOTE — Discharge Summary (Signed)
Fred Newton, Fred Newton NO.:  [id number]   MEDICAL RECORD NO.:  [id number]          PATIENT TYPE:  INP   LOCATION:  3007                         FACILITY:  [facility]   PHYSICIAN:  Lonia Blood, M.D.DATE OF BIRTH:  [birth year]   DATE OF ADMISSION:  07/20/2004  DATE OF DISCHARGE:  07/23/2004                                 DISCHARGE SUMMARY   CHIEF COMPLAINT:  Shortness of breath x2 weeks, progressively worsening over  24 hours.   DISCHARGE DIAGNOSES:  1.  Severe dyspnea on exertion secondary to severe emphysema exacerbated by      discontinuance of Advair Diskus maintenance medication.      1.  Status post intravenous to oral steroid taper to discontinuance with          respiratory status now stable.      2.  Mild hypoxemia on room air only with ambulation in the 88-92 range          but normalized above 92% with rest.  The patient not currently not          felt to be a candidate for home oxygen use, request follow-up pulse          oximetry with ambulation next excess, [facility], 3-4          weeks post-discharge.      3.  Converted to oral prednisone with slow taper to discontinuance post-          discharge.      4.  Advair Diskus and p.r.n. albuterol continued post-discharge.  2.  History of tachyarrhythmias, stable on digoxin over the course of      hospitalization.  3.  History of benign prostatic hypertrophy, stable over the course of      hospitalization on Flomax.  4.  Leukocytosis thought secondary to steroids with no indication of      pneumonia per radiology.  5.  Rule out pulmonary emboli with no indication per CT scan of the chest,      Jul 21, 2004.  6.  Questionable left lung nodule.      1.  CT scan of the chest on Jul 21, 2004 notes severe emphysema with          large left apical bullae.  A focal nodule posteriorly, left mid-          chest, that could reflect an area of nodular scarring or a small          tumor.  Follow-up CT  scan recommended.      2.  Please arrange outpatient CT scan with contrast to re-evaluate this          site 2-3 months post-discharge.  7.  Continued tobacco use with patient attempting discontinuance, current      topical nicotine patch.      1.  Request follow up of tobacco discontinuance post-discharge at next          office at [facility].  8.  Mild normocytic anemia, nonaggressively investigated over the course of  hospitalization with discharge hemoglobin stable 11.3.      1.  Request repeat CBC next office visit at [facility] in 3-4          weeks and further investigation of mild anemia at that time.  9.  Mild renal insufficiency with baseline BUN and creatinine 15 and 1.2,      respectively.  Discharge BUN and creatinine 20 and 1, respectively.      1.  Request repeat basic metabolic panel next office visit at [facility] office in 3-4 weeks.  10. Allergies listed.  No known drug allergies.  11. Code status.  Currently full code.   DISCHARGE MEDICATIONS:  1.  Flomax 0.4 mg once daily.  2.  Digoxin 0.125 mg daily.  3.  Enteric-coated aspirin 81 mg daily.  4.  Claritin 10 mg daily.  5.  Nicotine patch 21 mg topically daily and change once daily with wean per      manufacturer's instructions.  These are obtained over-the-counter.  6.  Advair Diskus 250/50 mg 1 puff inhaled twice daily.  7.  Mucinex 600 mg twice daily.  8.  Protonix 40 mg once daily.  9.  Albuterol inhaler 2 puffs every four hours as needed.  10. Prednisone taper 60 mg daily and decrease by 10 mg every three days to      discontinuance.   SPECIAL DISCHARGE INSTRUCTIONS:  1.  Disposition:  Discharged home where the patient lives independently.  2.  No particular restrictions.  3.  Discharge diet: Regular with no restrictions.  4.  Follow up:  [facility] office in 3-4 weeks post-discharge,      call [phone] for an appointment.  Will need recheck CBC and  basic      metabolic panel at that time.  He will need recheck pulse oximetry with      ambulation on room air.  5.  Repeat outpatient CAT scan with contrast in 2-3 months post-discharge to      re-evaluate possible left mid-lung nodule versus scar.   CONSULTS OVER THE COURSE OF HOSPITALIZATION:  None.   PROCEDURES OVER THE COURSE OF HOSPITALIZATION:  CT scan of the chest, Jul 21, 2004, with results above.  Again, this was negative for acute pulmonary  emboli.   DISCHARGE LABORATORY DATA:  Hemoglobin and hematocrit done Jul 23, 2004  found hemoglobin low though improved at 11.3 and hematocrit 33.  Basic  metabolic panel, Jul 23, 2004, sodium 134, potassium 5, chloride 100, CO2  29, BUN 20, and creatinine 1.  Arterial blood gas on room air:  pH 7.367,  PCO2 45.8, PO2 52.6, bicarb 26.2.  Serum digoxin level 0.7.  Radiology as  reported above.  In addition, two-view chest x-ray, Jul 21, 2004, notes only  severe bullous emphysema.   HISTORY OF PRESENT ILLNESS:  This 77 year old male lives at home  independently and is followed as an outpatient at [facility]  office by Lenon Curt. Chilton Si, M.D.  He had a known history of COPD with  worsening shortness of breath over two weeks, but significantly over 24  hours prior to admission  He had albuterol MDI, but the shortness of breath  was nonresponsive.  Apparently, he was enrolled in a respiratory study at  [facility] involving discontinuance of Advair for approximately one month  and then restarted of a trial medication.  The patient began experiencing  shortness of breath immediately and had apparently not restarted his trial  medication yet.  He denied fever, chills, nausea, vomiting, diarrhea.  He  had a nonproductive cough.  He was admitted for further evaluation and  treatment.   For dictated past medical history, admission medications, social history, review of systems, laboratory data, physical exam, impressions and plan, see   dictated admission history and physical dated Jul 20, 2004.   HOSPITAL COURSE:  Problem 1.  Severe dyspnea on exertion, felt likely due to  severe emphysema with discontinuance of his maintenance medication, Advair.  The patient presented as per history of present illness.  A chest x-ray at  time of admission noted only emphysema with no other acute abnormalities.  Antibiotics deferred as the patient had no leukocytosis or fever.  Again,  the etiology here was thought to be discontinuance of maintenance  medication.  He was placed on a steroid taper initially with IV Solu-Medrol.  His respiratory status rapidly improved to baseline.  He had a CT scan of  the chest to rule out pulmonary emboli, and this was negative.  At  discharge, the patient will taper prednisone starting 60 mg daily and  decreased by 10 mg every three days to discontinuance.  He has been  ambulating with pulse oximetry, and he is stable at rest, 92% or greater,  but mild desaturation, 88-92%, with ambulation.  The patient is reluctant to  consider and not felt to currently require home oxygen.  At discharge, the  plan will be to continue steroid taper and concurrent Advair Diskus with  p.r.n. albuterol.  The patient is to follow up with [facility]  office in 3-4 weeks and re-assessment of pulse oximetry with ambulation at  that time.   Problem 2.  History of tachyarrhythmias.  This remained stable over the  course of hospitalization on digoxin with serum level 0.7.  No adjustment in  medication dosage over the course of hospitalization.   Problem 3.  History of benign prostatic hypertrophy.  The patient on Flomax  for this, and this problem remained stable over the course of  hospitalization.   Problem 4.  Leukocytosis.  The patient did develop some leukocytosis post-  admission thought secondary to his IV steroids.  On admission, WBC 10.3.  Follow-up WBC 15.7.  No indication of active infection per  radiology or per  blood work.   Problem 5.  Rule out pulmonary emboli.  Again, this a consideration with  patient's complaints of shortness of breath.  A CT scan/angiogram of the  chest was done as above and again negative for pulmonary emboli.   Problem 6.  Left lung nodule versus scarring.  The patient did have a CT  scan/angiogram of the chest, Jul 21, 2004.  A secondary finding of a focal  nodule posteriorly mid-left chest thought to represent nodular scarring or a  small tumor.  Recommend follow-up CT scan 2-3 months post-discharge to re-  assess the site with contrast.   Problem 7.  Mild normocytic anemia.  The patient was noted to have a mild  normocytic anemia in the 11.0 range.  This was not aggressively investigated  over the course of hospitalization with no sign of acute bleed noted.  At  discharge, his hemoglobin remained stable at 11.3.  Request repeat CBC post- discharge at [facility] office follow-up visit and further  evaluation of anemia at that time.   Problem 8.  Mild chronic renal insufficiency.  The patient's BUN and  creatinine 22 and 1.1 per follow-up laboratory studies.  Mild hyponatremia  in the 131-134 range.  Request repeat basic metabolic panel on next office  visit at [facility] in 3-4 weeks.   Problem 9.  Code status.  The patient was full code over the course of  hospitalization.   DISPOSITION:  Discharged home where the patient lives independently.  Followup as above.   ACTIVITY:  Up as tolerated with restrictions.   DISCHARGE DIET:  Unrestricted, regular.   CONDITION UPON DISCHARGE:  Stable though frail with severe chronic  obstructive pulmonary disease.   DISCHARGE PROCESS:  Greater than 30 minutes, 11 a.m. through 11:50 a.m.     Everett Graff, N.P.      Lonia Blood, M.D.  Electronically Signed   TC/MEDQ  D:  07/23/2004  T:  07/23/2004  Job:  [id number]

## 2010-07-18 NOTE — Op Note (Signed)
   NAME:  Fred Newton, Fred Newton                      ACCOUNT NO.:  [id number]   MEDICAL RECORD NO.:  [id number]                   PATIENT TYPE:  AMB   LOCATION:  ENDO                                 FACILITY:  [facility]   PHYSICIAN:  Georgiana Spinner, M.D.                 DATE OF BIRTH:  1928/10/09   DATE OF PROCEDURE:  04/14/2002  DATE OF DISCHARGE:                                 OPERATIVE REPORT   PROCEDURE:  Upper endoscopy with biopsy.   INDICATIONS:  Known Barrett's esophagus.   ANESTHESIA:  Demerol 40 mg, Versed 4 mg.   DESCRIPTION OF PROCEDURE:  With the patient mildly sedated in the left  lateral decubitus position, the Olympus videoscopic endoscope was inserted  in the mouth, passed under direct vision through the esophagus, which  appeared normal until we reached the distal esophagus, and then there was an  area of Barrett's esophagus seen above a hiatal hernia, photographed and  biopsied.  We entered into the stomach.  the fundus, body, antrum, duodenal  bulb, second portion of duodenum appeared grossly normal.  From this point  the endoscope was slowly withdrawn, taking circumferential views of the  entire duodenal mucosa visualized until the endoscope had been pulled back  into the stomach, placed in retroflexion to view the stomach from below.  The endoscope was then straightened and withdrawn, taking circumferential  views of the remaining gastric and esophageal mucosa.  The patient's vital  signs and pulse oximetry remained stable.  The patient tolerated the  procedure well without apparent complications.   FINDINGS:  Barrett's esophagus, photographed and biopsied.   PLAN:  Await biopsy report.  The patient will call me for results and follow  up with me as an outpatient.                                                Georgiana Spinner, M.D.    GMO/MEDQ  D:  04/14/2002  T:  04/14/2002  Job:  [id number]   cc:   Lenon Curt. Chilton Si, M.D.  [address]  [place]  [place]  [address]  Fax: [phone]

## 2010-07-18 NOTE — Procedures (Signed)
[facility]  Patient:    Fred Newton, Fred Newton                   MRN: [id number] Proc. Date: 02/26/00 Adm. Date:  [id number] Attending:  Sabino Gasser                           Procedure Report  PROCEDURE:  Colonoscopy.  GASTROENTEROLOGIST:  Sabino Gasser, M.D.  INDICATIONS:  Hemoccult positive.  ANESTHESIA:  Demerol 70 mg, Versed 6.5 mg.  PROCEDURE IN DETAIL:  With the patient mildly sedated and in the left lateral decubitus position, a rectal exam was performed which was unremarkable. Subsequently, the Olympus video colonoscope was inserted in the rectum and then passed under direct vision to the cecum.  The cecum was identified by ileocecal valve and appendiceal orifice, both of which were photographed. However, the photographs were subsequently lost and not printed.  We also saw the terminal ileum which appeared normal on direct view, and the endoscope as then withdrawn taking circumferential views of the entire colonic mucosa, stopping in the rectum which appeared normal on direct and in retroflexed view as well.  The endoscope was straightened and withdrawn.  The patients vital signs and pulse oximetry remained stable.  The patient tolerated the procedure well with no apparent complications.  FINDINGS:  Essentially negative colonoscopic examination including to the terminal ileum.  PLAN:  Endoscopy. DD:  02/26/00 TD:  02/26/00 Job: [id number]

## 2011-03-03 HISTORY — PX: OTHER SURGICAL HISTORY: SHX169

## 2011-08-01 HISTORY — PX: OTHER SURGICAL HISTORY: SHX169

## 2013-03-02 HISTORY — PX: OTHER SURGICAL HISTORY: SHX169

## 2013-03-31 ENCOUNTER — Other Ambulatory Visit: Payer: Self-pay | Admitting: Urology

## 2013-04-24 ENCOUNTER — Encounter (HOSPITAL_COMMUNITY): Payer: Self-pay | Admitting: *Deleted

## 2013-04-27 ENCOUNTER — Encounter (HOSPITAL_COMMUNITY): Payer: Self-pay | Admitting: Pharmacy Technician

## 2013-05-02 ENCOUNTER — Ambulatory Visit (HOSPITAL_COMMUNITY): Admission: RE | Admit: 2013-05-02 | Payer: Medicare Other | Source: Ambulatory Visit | Admitting: Urology

## 2013-05-02 HISTORY — DX: Unspecified osteoarthritis, unspecified site: M19.90

## 2013-05-02 HISTORY — DX: Aortic aneurysm of unspecified site, without rupture: I71.9

## 2013-05-02 HISTORY — DX: Abdominal aortic aneurysm, without rupture: I71.4

## 2013-05-02 HISTORY — DX: Chronic obstructive pulmonary disease, unspecified: J44.9

## 2013-05-02 HISTORY — DX: Malignant (primary) neoplasm, unspecified: C80.1

## 2013-05-02 HISTORY — DX: Abdominal aortic aneurysm, without rupture, unspecified: I71.40

## 2013-05-02 HISTORY — DX: Emphysema, unspecified: J43.9

## 2013-05-02 HISTORY — DX: Gastro-esophageal reflux disease without esophagitis: K21.9

## 2013-05-02 SURGERY — INSERTION, PENILE PROSTHESIS, INFLATABLE
Anesthesia: General | Site: Penis

## 2013-10-03 ENCOUNTER — Other Ambulatory Visit: Payer: Self-pay | Admitting: Urology

## 2013-10-18 ENCOUNTER — Encounter (HOSPITAL_COMMUNITY): Payer: Self-pay | Admitting: Pharmacy Technician

## 2013-10-19 NOTE — Patient Instructions (Signed)
Fred Newton  10/19/2013   Your procedure is scheduled on:  10/27/13    Report to [facility].  Follow the Signs to [place] at [phone]       am  Call this number if you have problems the morning of surgery: [phone]   Remember:   Do not eat food or drink liquids after midnight.   Take these medicines the morning of surgery with A SIP OF WATER:    Do not wear jewelry,   Do not wear lotions, powders, or perfumes. , deodorant  . Men may shave face and neck.  Do not bring valuables to the hospital.  Contacts, dentures or bridgework may not be worn into surgery.      Patients discharged the day of surgery will not be allowed to drive  home.  Name and phone number of your driver:   [facility] - Preparing for Surgery Before surgery, you can play an important role.  Because skin is not sterile, your skin needs to be as free of germs as possible.  You can reduce the number of germs on your skin by washing with CHG (chlorahexidine gluconate) soap before surgery.  CHG is an antiseptic cleaner which kills germs and bonds with the skin to continue killing germs even after washing. Please DO NOT use if you have an allergy to CHG or antibacterial soaps.  If your skin becomes reddened/irritated stop using the CHG and inform your nurse when you arrive at Short Stay. Do not shave (including legs and underarms) for at least 48 hours prior to the first CHG shower.  You may shave your face/neck. Please follow these instructions carefully:  1.  Shower with CHG Soap the night before surgery and the  morning of Surgery.  2.  If you choose to wash your hair, wash your hair first as usual with your  normal  shampoo.  3.  After you shampoo, rinse your hair and body thoroughly to remove the  shampoo.                           4.  Use CHG as you would any other liquid soap.  You can apply chg directly  to the skin and wash                       Gently with a scrungie or clean washcloth.  5.   Apply the CHG Soap to your body ONLY FROM THE NECK DOWN.   Do not use on face/ open                           Wound or open sores. Avoid contact with eyes, ears mouth and genitals (private parts).                       Wash face,  Genitals (private parts) with your normal soap.             6.  Wash thoroughly, paying special attention to the area where your surgery  will be performed.  7.  Thoroughly rinse your body with warm water from the neck down.  8.  DO NOT shower/wash with your normal soap after using and rinsing off  the CHG Soap.                9.  [name]  yourself dry with a clean towel.            10.  Wear clean pajamas.            11.  Place clean sheets on your bed the night of your first shower and do not  sleep with pets. Day of Surgery : Do not apply any lotions/deodorants the morning of surgery.  Please wear clean clothes to the hospital/surgery center.  FAILURE TO FOLLOW THESE INSTRUCTIONS MAY RESULT IN THE CANCELLATION OF YOUR SURGERY PATIENT SIGNATURE_________________________________  NURSE SIGNATURE__________________________________  ________________________________________________________________________     Please read over the following fact sheets that you were given: MRSA Information, coughing and deep breathing exercises, leg exercises

## 2013-10-20 ENCOUNTER — Encounter (HOSPITAL_COMMUNITY): Payer: Self-pay

## 2013-10-20 ENCOUNTER — Encounter (HOSPITAL_COMMUNITY)
Admission: RE | Admit: 2013-10-20 | Discharge: 2013-10-20 | Disposition: A | Discharge status: Home / Self Care | Payer: Medicare Other | Source: Ambulatory Visit | Attending: Urology | Admitting: Urology

## 2013-10-20 DIAGNOSIS — Z01818 Encounter for other preprocedural examination: Secondary | ICD-10-CM | POA: Diagnosis present

## 2013-10-20 DIAGNOSIS — D414 Neoplasm of uncertain behavior of bladder: Secondary | ICD-10-CM | POA: Insufficient documentation

## 2013-10-20 HISTORY — DX: Shortness of breath: R06.02

## 2013-10-20 HISTORY — DX: Type 2 diabetes mellitus without complications: E11.9

## 2013-10-20 HISTORY — DX: Cardiac arrhythmia, unspecified: I49.9

## 2013-10-20 HISTORY — DX: Pneumothorax, unspecified: J93.9

## 2013-10-20 LAB — CBC
HCT: 34 % — ABNORMAL LOW (ref 39.0–52.0)
Hemoglobin: 11.6 g/dL — ABNORMAL LOW (ref 13.0–17.0)
MCH: 31.7 pg (ref 26.0–34.0)
MCHC: 34.1 g/dL (ref 30.0–36.0)
MCV: 92.9 fL (ref 78.0–100.0)
Platelets: 274 10*3/uL (ref 150–400)
RBC: 3.66 MIL/uL — ABNORMAL LOW (ref 4.22–5.81)
RDW: 13.9 % (ref 11.5–15.5)
WBC: 8.7 10*3/uL (ref 4.0–10.5)

## 2013-10-20 LAB — BASIC METABOLIC PANEL
Anion gap: 12 (ref 5–15)
BUN: 23 mg/dL (ref 6–23)
CO2: 25 mEq/L (ref 19–32)
CREATININE: 1.37 mg/dL — AB (ref 0.50–1.35)
Calcium: 9.2 mg/dL (ref 8.4–10.5)
Chloride: 103 mEq/L (ref 96–112)
GFR, EST AFRICAN AMERICAN: 54 mL/min — AB (ref >90)
GFR, EST NON AFRICAN AMERICAN: 46 mL/min — AB (ref >90)
Glucose, Bld: 97 mg/dL (ref 70–99)
POTASSIUM: 4.9 meq/L (ref 3.7–5.3)
Sodium: 140 mEq/L (ref 137–147)

## 2013-10-20 NOTE — Progress Notes (Signed)
BMP results sent via EPIC to Dr Junious Silk

## 2013-10-20 NOTE — Progress Notes (Signed)
Ct abd and pelvis - 08/03/2013 on chart discusses lungs.   Stress test 05/23/13 on chart  Carotid Duplex on chart- 01/03/13 on chart  ECHO- 01/03/13 on chart  EKG- 05/12/13 on chart

## 2013-10-23 ENCOUNTER — Other Ambulatory Visit (HOSPITAL_COMMUNITY): Payer: Medicare Other

## 2013-10-26 NOTE — H&P (Signed)
History of Present Illness     F/u -       1- prostate cancer -  PCa - T1c, PSA 9.8 - 11.2, 57 g prostate, Gleason 3+3=6 rt base and apex, 3+4=7 rt mid  -Apr 2012 treated with Cryotherapy and 6 mo neoadjuvant ADT (Trelstar) which shrunk the prostate to 33 g. He needed a foley catheter for about a month post-op.   -Since cryo he feels his penis has shrunk. He has had large hemorrhoids. He had surgery for these. He's had a UTI (e coli) resistant to FQ's and Bactrim but sensitive to cefazolin and nitrofurantoin. He gets foul smelling urine and mild dysuria. He was treated with nitrofurantoin, which was changed to another abx due to rash and itching.   -Sep 2013 PSA 0.67   -Mar 2014 c/o watery stools, PSA 0.66  -Sept 2014 PSA 0.6    2-weak stream - on tamsulosin. Tried to wean off tamsulosin but had a weak flow and has continued it.     3-  Impotence  He does complain of continued sexual dysfunction. He has a very low libido and trouble getting and maintaining erection. He and his wife are interested in resuming sexual activity. He has tried Viagra in the past and also the vacuum erection device but has not try either since his cryotherapy.   -Mar 2013 T 335  -Sept 2013 continued VED, started Levitra. He has no CP or NTG use  -Mar 2014 Levitra not effective; VED effective but his penis is too "flaky" and he has an "anuerysm" on his penis. Also his penis is cold.  -Sept 2014 he tried some Viagra and it produced no erection. Discussed IPP and the watch the video.        Jul 2015 interval hx  Patient returns with complaints of gross hematuria. He had gross hematuria about 4 months ago and was treated with Levaquin for UTI. The bleeding occurred yesterday morning. It was grossly bloody and he passed a clot. It did clear quickly. He saw Dr. Woody Seller yesterday and brought his labs. UA showed 20-50 red blood cells, PSA 0.73. He had some pain in the left lower quadrant and  underwent CT of the abdomen and pelvis June 2015. I read the report and is specifically commented on normal kidneys and ureters and bladder. This was all done in [place], [place]. Near the patient's home. He's had no dysuria.    The discussed to need cystoscopy in the nature risks and benefits of cystoscopy. I told him I was concerned he could have a bladder infection cystoscopy could make this much worse but he wanted to go ahead and proceed with cystoscopy today as he lives 41 miles from here. His urinalysis overall just shows rare bacteria but I didn't send it for culture as a precaution.    Past Medical History Problems  1. History of Atrial fibrillation (427.31) 2. History of Benign prostatic hyperplasia with urinary obstruction (600.01,599.69) 3. History of Chronic obstructive pulmonary disease (496) 4. History of Disorder of heart rhythm (427.9) 5. History of bronchitis (V12.69) 6. History of heartburn (V12.79) 7. History of hypercholesterolemia (V12.29)  Surgical History Problems  1. History of Lung Surgery 2. History of Surgery Prostate Cryosurgical Ablation  Current Meds 1. Advair Diskus 250-50 MCG/DOSE MISC;  Therapy: (Recorded:17Dec2007) to Recorded 2. Aspirin 81 MG Oral Tablet;  Therapy: (Recorded:17Dec2007) to Recorded 3. Astelin 137 MCG/SPRAY SOLN;  Therapy: (Recorded:13Feb2015) to Recorded 4. Boniva 150 MG Oral Tablet;  Therapy: (Recorded:11Mar2013)  to Recorded 5. Calcium + D TABS;  Therapy: (Recorded:17Dec2007) to Recorded 6. Centrum Silver TABS;  Therapy: (Recorded:17Dec2007) to Recorded 7. Fish Oil CAPS;  Therapy: (Recorded:24Aug2009) to Recorded 8. Flonase 50 MCG/ACT Nasal Suspension;  Therapy: (Recorded:13Feb2015) to Recorded 9. Metamucil 1.7 GM WAFR;  Therapy: (Recorded:11Mar2013) to Recorded 10. NexIUM 40 MG Oral Capsule Delayed Release;   Therapy: (Recorded:11Mar2013) to Recorded 11. Oxygen Use;   Therapy: (Recorded:11Mar2013) to Recorded 12. Proventil  HFA 108 (90 Base) MCG/ACT Inhalation Aerosol Solution;   Therapy: (Recorded:29Jul2015) to Recorded 13. Simvastatin 40 MG Oral Tablet;   Therapy: (Recorded:11Mar2013) to Recorded 14. Spiriva HandiHaler 18 MCG Inhalation Capsule;   Therapy: (Recorded:24Aug2009) to Recorded 15. Tamsulosin HCl - 0.4 MG Oral Capsule;   Therapy: (Recorded:26Mar2014) to Recorded 16. ZyrTEC Allergy TABS;   Therapy: (Recorded:11Mar2013) to Recorded  Allergies Medication  1. Nitrofurantoin CAPS 2. Omnicef CAPS  Family History Problems  1. Family history of Colon Cancer (V16.0) : Mother 2. Family history of Death In The Family Father : Father   Age 88 3. Family history of Death In The Family Mother : Mother   Colon cancerAge 55 4. Family history of Family Health Status Number Of Children   1 daughter 5. Family history of Tuberculosis : Sister  Social History Problems  1. Denied: Alcohol Use 2. Caffeine Use   Coffee 2-3/day 3. Former smoker (V15.82) 4. Marital History - Currently Married 5. Occupation:   Retired 51. Tobacco Use (V15.82)   1-1 1/2 packs per day 57 yrs., quit 6 months ago  Vitals Vital Signs [Data Includes: Last 1 Day]  Recorded: 29Jul2015 04:13PM  Weight: 170 lb  BMI Calculated: 22.43 BSA Calculated: 2.01 Blood Pressure: 116 / 72 Temperature: 98.3 F Heart Rate: 92  Physical Exam Constitutional: Well nourished and well developed . No acute distress.  Neuro/Psych:. Mood and affect are appropriate.    Results/Data Urine [Data Includes: Last 1 Day]   [id number]  COLOR YELLOW   APPEARANCE CLEAR   SPECIFIC GRAVITY 1.020   pH 5.5   GLUCOSE NEG mg/dL  BILIRUBIN NEG   KETONE NEG mg/dL  BLOOD MOD   PROTEIN NEG mg/dL  UROBILINOGEN 0.2 mg/dL  NITRITE NEG   LEUKOCYTE ESTERASE NEG   SQUAMOUS EPITHELIAL/HPF NONE SEEN   WBC 3-6 WBC/hpf  RBC 3-6 RBC/hpf  BACTERIA RARE   CRYSTALS NONE SEEN   CASTS NONE SEEN   Other OVAL FAT BODIES NOTED    Procedure  Procedure:  Cystoscopy   Informed Consent: Risks, benefits, and potential adverse events were discussed and informed consent was obtained from the patient.  Prep: The patient was prepped with betadine.  Antibiotic prophylaxis: Ciprofloxacin.  Procedure Note:  Urethral meatus:. No abnormalities.  Anterior urethra: No abnormalities.  Prostatic urethra: No abnormalities.  Bladder: Visulization was clear. The ureteral orifices were in the normal anatomic position bilaterally and had clear efflux of urine. Examination of the bladder demonstrated erythematous mucosa. A papillary tumor was seen in the bladder. This tumor was located on the right side, on the posterior aspect of the bladder. The patient tolerated the procedure well.  Complications: None.    Assessment Assessed  1. Gross hematuria (599.71) 2. Prostate cancer (185) 3. 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 Follow-up Schedule Surgery Office  Follow-up  Status: Hold For - Appointment   Requested for: [phone] Gross hematuria  2. Cysto; Status:Hold For - Appointment,Date of Service; Requested for:29Jul2015;  Health Maintenance  3. UA With REFLEX; [Do Not Release];  Status:Complete;   Done: [id number] 03:19PM  URINE CULTURE; Status:In Progress - Specimen/Data Collected;  Done: [phone] Perform:[facility]; Due:31Jul2015; Marked Important; Last Updated MG:NOIBBC, Santiago Glad; 09/27/2013 4:23:56 PM;Ordered; Today;  WUG:QBVQX hematuria; Ordered IH:WTUUEKCM, Rodman Key;   Discussion/Summary Prostate cancer-PSA is stable    Gross hematuria , bladder neoplasm - I discussed with the patient the cystoscopic findings. He has a bladder tumor and some surrounding erythema. It is close to the right ureteral orifice. We discussed the nature risks benefits and alternatives to TURBT with postoperative mitomycin C. We discussed he may need a right ureteral stent. We discussed the role of the mitomycin should prevent recurrences of bladder neoplasms tend  to recur. All questions answered and he elects to proceed. He did tell me that he had a recent cardiac stress test with Dr. Woody Seller and he believes it was normal. We'll check with Dr Woody Seller to ensure patient is clear for surgery.          Signatures Electronically signed by : Festus Aloe, M.D.; Sep 27 2013  5:17PM EST

## 2013-10-27 ENCOUNTER — Ambulatory Visit (HOSPITAL_COMMUNITY): Payer: Medicare Other | Admitting: Certified Registered Nurse Anesthetist

## 2013-10-27 ENCOUNTER — Encounter (HOSPITAL_COMMUNITY): Payer: Medicare Other | Admitting: Certified Registered Nurse Anesthetist

## 2013-10-27 ENCOUNTER — Encounter (HOSPITAL_COMMUNITY): Payer: Self-pay | Admitting: *Deleted

## 2013-10-27 ENCOUNTER — Encounter (HOSPITAL_COMMUNITY)
Admission: RE | Disposition: A | Discharge status: Home / Self Care | Payer: Self-pay | Source: Ambulatory Visit | Attending: Urology

## 2013-10-27 ENCOUNTER — Ambulatory Visit (HOSPITAL_COMMUNITY)
Admission: RE | Admit: 2013-10-27 | Discharge: 2013-10-27 | Disposition: A | Discharge status: Home / Self Care | Payer: Medicare Other | Source: Ambulatory Visit | Attending: Urology | Admitting: Urology

## 2013-10-27 DIAGNOSIS — J449 Chronic obstructive pulmonary disease, unspecified: Secondary | ICD-10-CM | POA: Insufficient documentation

## 2013-10-27 DIAGNOSIS — N529 Male erectile dysfunction, unspecified: Secondary | ICD-10-CM | POA: Insufficient documentation

## 2013-10-27 DIAGNOSIS — R39198 Other difficulties with micturition: Secondary | ICD-10-CM | POA: Insufficient documentation

## 2013-10-27 DIAGNOSIS — Z79899 Other long term (current) drug therapy: Secondary | ICD-10-CM | POA: Insufficient documentation

## 2013-10-27 DIAGNOSIS — K219 Gastro-esophageal reflux disease without esophagitis: Secondary | ICD-10-CM | POA: Insufficient documentation

## 2013-10-27 DIAGNOSIS — Z87891 Personal history of nicotine dependence: Secondary | ICD-10-CM | POA: Diagnosis not present

## 2013-10-27 DIAGNOSIS — Z7982 Long term (current) use of aspirin: Secondary | ICD-10-CM | POA: Diagnosis not present

## 2013-10-27 DIAGNOSIS — Z8546 Personal history of malignant neoplasm of prostate: Secondary | ICD-10-CM | POA: Insufficient documentation

## 2013-10-27 DIAGNOSIS — C679 Malignant neoplasm of bladder, unspecified: Secondary | ICD-10-CM | POA: Diagnosis not present

## 2013-10-27 DIAGNOSIS — D494 Neoplasm of unspecified behavior of bladder: Secondary | ICD-10-CM | POA: Diagnosis present

## 2013-10-27 DIAGNOSIS — IMO0002 Reserved for concepts with insufficient information to code with codable children: Secondary | ICD-10-CM | POA: Diagnosis not present

## 2013-10-27 DIAGNOSIS — E119 Type 2 diabetes mellitus without complications: Secondary | ICD-10-CM | POA: Diagnosis not present

## 2013-10-27 DIAGNOSIS — J4489 Other specified chronic obstructive pulmonary disease: Secondary | ICD-10-CM | POA: Insufficient documentation

## 2013-10-27 DIAGNOSIS — D414 Neoplasm of uncertain behavior of bladder: Secondary | ICD-10-CM

## 2013-10-27 HISTORY — PX: TRANSURETHRAL RESECTION OF BLADDER TUMOR: SHX2575

## 2013-10-27 LAB — GLUCOSE, CAPILLARY
GLUCOSE-CAPILLARY: 101 mg/dL — AB (ref 70–99)
Glucose-Capillary: 107 mg/dL — ABNORMAL HIGH (ref 70–99)

## 2013-10-27 SURGERY — TURBT (TRANSURETHRAL RESECTION OF BLADDER TUMOR)
Anesthesia: General | Site: Bladder

## 2013-10-27 MED ORDER — ONDANSETRON HCL 4 MG/2ML IJ SOLN
INTRAMUSCULAR | Status: AC
  Filled 2013-10-27: qty 2

## 2013-10-27 MED ORDER — CEFAZOLIN SODIUM-DEXTROSE 2-3 GM-% IV SOLR
INTRAVENOUS | Status: AC
  Filled 2013-10-27: qty 50

## 2013-10-27 MED ORDER — FENTANYL CITRATE 0.05 MG/ML IJ SOLN
INTRAMUSCULAR | Status: AC
  Filled 2013-10-27: qty 2

## 2013-10-27 MED ORDER — LIDOCAINE HCL (CARDIAC) 20 MG/ML IV SOLN
INTRAVENOUS | Status: AC
  Filled 2013-10-27: qty 5

## 2013-10-27 MED ORDER — LIDOCAINE HCL 2 % EX GEL
CUTANEOUS | Status: AC
  Filled 2013-10-27: qty 10

## 2013-10-27 MED ORDER — PROPOFOL 10 MG/ML IV BOLUS
INTRAVENOUS | Status: AC
  Filled 2013-10-27: qty 20

## 2013-10-27 MED ORDER — SODIUM CHLORIDE 0.9 % IJ SOLN
INTRAMUSCULAR | Status: AC
  Filled 2013-10-27: qty 10

## 2013-10-27 MED ORDER — FENTANYL CITRATE 0.05 MG/ML IJ SOLN
INTRAMUSCULAR | Status: DC | PRN
  Administered 2013-10-27 (×4): 25 ug via INTRAVENOUS

## 2013-10-27 MED ORDER — 0.9 % SODIUM CHLORIDE (POUR BTL) OPTIME
TOPICAL | Status: DC | PRN
  Administered 2013-10-27: 1000 mL

## 2013-10-27 MED ORDER — PHENAZOPYRIDINE HCL 100 MG PO TABS: 100.0000 mg | ORAL_TABLET | Freq: Three times a day (TID) | ORAL | Status: DC | PRN

## 2013-10-27 MED ORDER — LACTATED RINGERS IV SOLN
INTRAVENOUS | Status: DC
  Administered 2013-10-27: 1000 mL via INTRAVENOUS

## 2013-10-27 MED ORDER — EPHEDRINE SULFATE 50 MG/ML IJ SOLN
INTRAMUSCULAR | Status: AC
  Filled 2013-10-27: qty 1

## 2013-10-27 MED ORDER — PROPOFOL 10 MG/ML IV BOLUS
INTRAVENOUS | Status: DC | PRN
  Administered 2013-10-27: 150 mg via INTRAVENOUS

## 2013-10-27 MED ORDER — LIDOCAINE HCL (CARDIAC) 20 MG/ML IV SOLN
INTRAVENOUS | Status: DC | PRN
  Administered 2013-10-27: 100 mg via INTRAVENOUS

## 2013-10-27 MED ORDER — CEFAZOLIN SODIUM-DEXTROSE 2-3 GM-% IV SOLR
2.0000 g | INTRAVENOUS | Status: AC
  Administered 2013-10-27: 2 g via INTRAVENOUS

## 2013-10-27 MED ORDER — BELLADONNA ALKALOIDS-OPIUM 16.2-60 MG RE SUPP
RECTAL | Status: AC
  Filled 2013-10-27: qty 1

## 2013-10-27 MED ORDER — EPHEDRINE SULFATE 50 MG/ML IJ SOLN
INTRAMUSCULAR | Status: DC | PRN
  Administered 2013-10-27 (×2): 10 mg via INTRAVENOUS

## 2013-10-27 MED ORDER — FENTANYL CITRATE 0.05 MG/ML IJ SOLN
25.0000 ug | INTRAMUSCULAR | Status: DC | PRN
  Administered 2013-10-27 (×2): 25 ug via INTRAVENOUS

## 2013-10-27 MED ORDER — ONDANSETRON HCL 4 MG/2ML IJ SOLN
INTRAMUSCULAR | Status: DC | PRN
  Administered 2013-10-27: 4 mg via INTRAVENOUS

## 2013-10-27 MED ORDER — IOHEXOL 300 MG/ML  SOLN
INTRAMUSCULAR | Status: DC | PRN
  Administered 2013-10-27: 10 mL

## 2013-10-27 MED ORDER — MITOMYCIN CHEMO FOR BLADDER INSTILLATION 40 MG
40.0000 mg | Freq: Once | INTRAVENOUS | Status: AC
Start: 2013-10-27 — End: 2013-10-27
  Administered 2013-10-27: 40 mg via INTRAVESICAL
  Filled 2013-10-27: qty 40

## 2013-10-27 SURGICAL SUPPLY — 18 items
BAG URINE DRAINAGE (UROLOGICAL SUPPLIES) IMPLANT
BAG URO CATCHER STRL LF (DRAPE) ×3 IMPLANT
CATH URET 5FR 28IN CONE TIP (BALLOONS) ×2
CATH URET 5FR 70CM CONE TIP (BALLOONS) ×1 IMPLANT
DRAPE CAMERA CLOSED 9X96 (DRAPES) ×5 IMPLANT
ELECT BUTTON HF 24-28F 2 30DE (ELECTRODE) ×1 IMPLANT
ELECT LOOP MED HF 24F 12D (CUTTING LOOP) ×3 IMPLANT
ELECT LOOP MED HF 24F 12D CBL (CLIP) ×1 IMPLANT
ELECT REM PT RETURN 9FT ADLT (ELECTROSURGICAL) ×3
ELECT RESECT VAPORIZE 12D CBL (ELECTRODE) IMPLANT
ELECTRODE REM PT RTRN 9FT ADLT (ELECTROSURGICAL) ×1 IMPLANT
GLOVE BIOGEL M STRL SZ7.5 (GLOVE) ×5 IMPLANT
GOWN STRL REUS W/TWL XL LVL3 (GOWN DISPOSABLE) ×3 IMPLANT
KIT ASPIRATION TUBING (SET/KITS/TRAYS/PACK) IMPLANT
MANIFOLD NEPTUNE II (INSTRUMENTS) ×3 IMPLANT
PACK CYSTO (CUSTOM PROCEDURE TRAY) ×3 IMPLANT
TUBING CONNECTING 10 (TUBING) ×2 IMPLANT
TUBING CONNECTING 10' (TUBING) ×1

## 2013-10-27 NOTE — Anesthesia Postprocedure Evaluation (Signed)
  Anesthesia Post-op Note  Patient: Fred Newton  Procedure(s) Performed: Procedure(s) (LRB): TRANSURETHRAL RESECTION OF BLADDER TUMOR (TURBT) WITH FULGURATION MITOMYCIN C/POSSIBLE JJ STENT   (N/A)  Patient Location: PACU  Anesthesia Type: General  Level of Consciousness: awake and alert   Airway and Oxygen Therapy: Patient Spontanous Breathing  Post-op Pain: mild  Post-op Assessment: Post-op Vital signs reviewed, Patient's Cardiovascular Status Stable, Respiratory Function Stable, Patent Airway and No signs of Nausea or vomiting  Last Vitals:  Filed Vitals:   10/27/13 1326  BP: 136/65  Pulse: 90  Temp:   Resp: 18    Post-op Vital Signs: stable   Complications: No apparent anesthesia complications

## 2013-10-27 NOTE — Anesthesia Preprocedure Evaluation (Signed)
Anesthesia Evaluation  Patient identified by MRN, date of birth, ID band Patient awake    Reviewed: Allergy & Precautions, H&P , NPO status , Patient's Chart, lab work & pertinent test results  Airway Mallampati: II TM Distance: >3 FB Neck ROM: Full    Dental no notable dental hx.    Pulmonary shortness of breath, COPD COPD inhaler, former smoker,  breath sounds clear to auscultation  Pulmonary exam normal       Cardiovascular + Peripheral Vascular Disease negative cardio ROS  + dysrhythmias Rhythm:Regular Rate:Normal     Neuro/Psych negative neurological ROS  negative psych ROS   GI/Hepatic Neg liver ROS, GERD-  Medicated,  Endo/Other  diabetes, Type 2  Renal/GU negative Renal ROS  negative genitourinary   Musculoskeletal negative musculoskeletal ROS (+)   Abdominal   Peds negative pediatric ROS (+)  Hematology negative hematology ROS (+)   Anesthesia Other Findings   Reproductive/Obstetrics negative OB ROS                           Anesthesia Physical Anesthesia Plan  ASA: III  Anesthesia Plan: General   Post-op Pain Management:    Induction: Intravenous  Airway Management Planned: LMA  Additional Equipment:   Intra-op Plan:   Post-operative Plan: Extubation in OR  Informed Consent: I have reviewed the patients History and Physical, chart, labs and discussed the procedure including the risks, benefits and alternatives for the proposed anesthesia with the patient or authorized representative who has indicated his/her understanding and acceptance.   Dental advisory given  Plan Discussed with: CRNA  Anesthesia Plan Comments:         Anesthesia Quick Evaluation

## 2013-10-27 NOTE — Discharge Instructions (Signed)
Cystoscopy, Care After   Refer to this sheet in the next few weeks. These instructions provide you with information on caring for yourself after your procedure. Your caregiver may also give you more specific instructions. Your treatment has been planned according to current medical practices, but problems sometimes occur. Call your caregiver if you have any problems or questions after your procedure.   HOME CARE INSTRUCTIONS   Things you can do to ease any discomfort after your procedure include:   Drinking enough water and fluids to keep your urine clear or pale yellow.   Taking a warm bath to relieve any burning feelings.  SEEK IMMEDIATE MEDICAL CARE IF:   You have an increase in blood in your urine.   You notice blood clots in your urine.   You have difficulty passing urine.   You have the chills.   You have abdominal pain.   You have a fever or persistent symptoms for more than 2-3 days.   You have a fever and your symptoms suddenly get worse.  MAKE SURE YOU:   Understand these instructions.   Will watch your condition.   Will get help right away if you are not doing well or get worse.  Document Released: 09/05/2004 Document Revised: 10/19/2012 Document Reviewed: 08/10/2011   ExitCare® Patient Information ©2015 [employer]. This information is not intended to replace advice given to you by your health care provider. Make sure you discuss any questions you have with your health care provider.

## 2013-10-27 NOTE — Interval H&P Note (Signed)
History and Physical Interval Note:  10/27/2013 9:23 AM  Fred Newton  has presented today for surgery, with the diagnosis of BLADDER NEOPLASM  The various methods of treatment have been discussed with the patient and family. After consideration of risks, benefits and other options for treatment, the patient has consented to  Procedure(s): TRANSURETHRAL RESECTION OF BLADDER TUMOR (TURBT) WITH MITOMYCIN C/POSSIBLE JJ STENT   (N/A) as a surgical intervention .  The patient's history has been reviewed, patient examined, no change in status, stable for surgery.  I have reviewed the patient's chart and labs.  Questions were answered to the patient's satisfaction.  Discussed possible right ureteral stent and foley. Pt had post-op retention after cryo and we discussed leaving foley over weekend but pt wants to try without it. Also discussed need for retrograde pyelograms. He's been well - no CP, SOB, fever or dysuria.    Festus Aloe

## 2013-10-27 NOTE — Transfer of Care (Signed)
Immediate Anesthesia Transfer of Care Note  Patient: Fred Newton  Procedure(s) Performed: Procedure(s): TRANSURETHRAL RESECTION OF BLADDER TUMOR (TURBT) WITH FULGURATION MITOMYCIN C/POSSIBLE JJ STENT   (N/A)  Patient Location: PACU  Anesthesia Type:General  Level of Consciousness: awake, alert  and oriented  Airway & Oxygen Therapy: Patient Spontanous Breathing and Patient connected to face mask oxygen  Post-op Assessment: Report given to PACU RN and Post -op Vital signs reviewed and stable  Post vital signs: Reviewed and stable  Complications: No apparent anesthesia complications

## 2013-10-27 NOTE — Op Note (Signed)
Preoperative diagnosis: Bladder neoplasm uncertain malignant potential Postoperative diagnosis: Same  Procedure: Exam under anesthesia Cystoscopy TURBT 2-5 cm Bilateral retrograde pyelogram Mitomycin-C installation in PACU  Surgeon: Junious Silk Anesthesia: [place]  Type of anesthesia: Gen.  Findings: On exam under anesthesia the penis was normal without mass or lesion. Testicles were descended bilaterally and palpably normal. On digital rectal exam the prostate was small and smooth. There were no hard areas or nodules and all landmarks were preserved.  On cystoscopy the urethra appeared normal. The prostate was short and did not appear obstructive. The bladder had mild trabeculation. There was a tumor a few centimeters posterior to the right ureteral orifice and superior and inferior to this tumor there was a flat-appearing neoplasm. These areas appeared to be early papillary tumor or CIS. There is also a small area of this on the right bladder wall. The remainder the mucosa appeared normal. There were no stones or foreign bodies in the bladder.  Retrograde pyelograms were normal performed after the bladder was irrigated with water multiple times. It should be noted retrograde and washout were monitored after all biopsies and fulguration. Retrograde pyelogram interpretation as follows:  Right retrograde pyelogram-this outlined a single ureter single collecting system unit without filling defect, stricture or dilation. Left retrograde pyelogram-this outlined and single ureter single collecting system unit without filling defect, stricture or dilation.  There was brisk complete washout of both sides.   Description of procedure: After consent was obtained patient brought to the operating room. After adequate anesthesia he was placed in lithotomy position and prepped and draped in the usual sterile fashion. A timeout was performed to confirm the patient and procedure. An exam under anesthesia  was performed. Cystoscope was advanced per urethra and the bladder carefully examined with a 12 and 70 lens. The resectoscope was in place with the visual obturator and the cold cup was used to biopsy the right bladder wall. The cold cup was used to biopsy the flat tumor above the papillary tumor and below it and these were called right posterior superior and right posterior inferior. These areas were then fulgurated with the loop as well as all the abnormal appearing mucosa surrounding these areas. The loop was then used to resect a small tumor. This area was fulgurated. Ureteral orifice was not involved. All of these areas seem to be superficial.  There was excellent hemostasis at low-pressure. The resectoscope was removed and I repassed the cystoscope. Wash the bladder out multiple times with water. A cone tip catheter was then used to cannulate the right ureteral orifice and retrograde injection of contrast was performed. Similarly the left UO was cannulated and retrograde injection of contrast was performed. There was brisk complete washout of both sides.  The bladder was filled and the scope removed. The urethra was infiltrated with lidocaine jelly. An 48 French Foley was passed without difficulty and connected to gravity drainage. The urine was clear.  He was awakened and taken to the recovery room in stable condition.  Complications: None Blood loss: Minimal  Drains: 28 French Foley-mitomycin-C will be instilled, drained after 50 minutes and plan for Foley removal.  Specimens-all specimens to pathology: #1 right bladder wall #2 right posterior superior #3 right posterior inferior #4 right bladder tumor

## 2013-10-30 ENCOUNTER — Encounter (HOSPITAL_COMMUNITY): Payer: Self-pay | Admitting: Urology

## 2014-09-14 ENCOUNTER — Other Ambulatory Visit: Payer: Self-pay | Admitting: Urology

## 2014-09-28 ENCOUNTER — Other Ambulatory Visit (HOSPITAL_COMMUNITY): Payer: Medicare Other

## 2014-10-04 ENCOUNTER — Encounter (HOSPITAL_COMMUNITY): Payer: Self-pay

## 2014-10-04 ENCOUNTER — Other Ambulatory Visit: Payer: Self-pay

## 2014-10-04 ENCOUNTER — Encounter (HOSPITAL_COMMUNITY)
Admission: RE | Admit: 2014-10-04 | Discharge: 2014-10-04 | Disposition: A | Discharge status: Home / Self Care | Payer: Medicare Other | Source: Ambulatory Visit | Attending: Urology | Admitting: Urology

## 2014-10-04 ENCOUNTER — Other Ambulatory Visit (HOSPITAL_COMMUNITY): Payer: Medicare Other

## 2014-10-04 DIAGNOSIS — I272 Other secondary pulmonary hypertension: Secondary | ICD-10-CM | POA: Diagnosis not present

## 2014-10-04 DIAGNOSIS — C670 Malignant neoplasm of trigone of bladder: Secondary | ICD-10-CM | POA: Diagnosis not present

## 2014-10-04 DIAGNOSIS — Z7982 Long term (current) use of aspirin: Secondary | ICD-10-CM | POA: Diagnosis not present

## 2014-10-04 DIAGNOSIS — R0602 Shortness of breath: Secondary | ICD-10-CM

## 2014-10-04 DIAGNOSIS — Z8546 Personal history of malignant neoplasm of prostate: Secondary | ICD-10-CM | POA: Diagnosis not present

## 2014-10-04 DIAGNOSIS — C674 Malignant neoplasm of posterior wall of bladder: Secondary | ICD-10-CM | POA: Diagnosis not present

## 2014-10-04 DIAGNOSIS — Z87891 Personal history of nicotine dependence: Secondary | ICD-10-CM | POA: Diagnosis not present

## 2014-10-04 DIAGNOSIS — C679 Malignant neoplasm of bladder, unspecified: Secondary | ICD-10-CM | POA: Diagnosis present

## 2014-10-04 DIAGNOSIS — C678 Malignant neoplasm of overlapping sites of bladder: Secondary | ICD-10-CM | POA: Diagnosis not present

## 2014-10-04 DIAGNOSIS — J449 Chronic obstructive pulmonary disease, unspecified: Secondary | ICD-10-CM | POA: Diagnosis not present

## 2014-10-04 DIAGNOSIS — N529 Male erectile dysfunction, unspecified: Secondary | ICD-10-CM | POA: Diagnosis not present

## 2014-10-04 DIAGNOSIS — E780 Pure hypercholesterolemia: Secondary | ICD-10-CM | POA: Diagnosis not present

## 2014-10-04 DIAGNOSIS — E119 Type 2 diabetes mellitus without complications: Secondary | ICD-10-CM | POA: Diagnosis not present

## 2014-10-04 DIAGNOSIS — Z79899 Other long term (current) drug therapy: Secondary | ICD-10-CM | POA: Diagnosis not present

## 2014-10-04 DIAGNOSIS — C672 Malignant neoplasm of lateral wall of bladder: Secondary | ICD-10-CM | POA: Diagnosis not present

## 2014-10-04 DIAGNOSIS — R312 Other microscopic hematuria: Secondary | ICD-10-CM | POA: Diagnosis not present

## 2014-10-04 DIAGNOSIS — D090 Carcinoma in situ of bladder: Secondary | ICD-10-CM | POA: Diagnosis not present

## 2014-10-04 HISTORY — DX: Cough: R05

## 2014-10-04 HISTORY — DX: Dependence on supplemental oxygen: Z99.81

## 2014-10-04 HISTORY — DX: Cough, unspecified: R05.9

## 2014-10-04 LAB — CBC
HCT: 32.6 % — ABNORMAL LOW (ref 39.0–52.0)
Hemoglobin: 11 g/dL — ABNORMAL LOW (ref 13.0–17.0)
MCH: 31.2 pg (ref 26.0–34.0)
MCHC: 33.7 g/dL (ref 30.0–36.0)
MCV: 92.4 fL (ref 78.0–100.0)
Platelets: 227 10*3/uL (ref 150–400)
RBC: 3.53 MIL/uL — ABNORMAL LOW (ref 4.22–5.81)
RDW: 13.3 % (ref 11.5–15.5)
WBC: 24.8 10*3/uL — ABNORMAL HIGH (ref 4.0–10.5)

## 2014-10-04 LAB — BASIC METABOLIC PANEL
ANION GAP: 7 (ref 5–15)
BUN: 39 mg/dL — ABNORMAL HIGH (ref 6–20)
CALCIUM: 9 mg/dL (ref 8.9–10.3)
CO2: 25 mmol/L (ref 22–32)
CREATININE: 1.6 mg/dL — AB (ref 0.61–1.24)
Chloride: 104 mmol/L (ref 101–111)
GFR calc non Af Amer: 38 mL/min — ABNORMAL LOW (ref >60)
GFR, EST AFRICAN AMERICAN: 44 mL/min — AB (ref >60)
Glucose, Bld: 154 mg/dL — ABNORMAL HIGH (ref 65–99)
Potassium: 4.9 mmol/L (ref 3.5–5.1)
SODIUM: 136 mmol/L (ref 135–145)

## 2014-10-04 NOTE — Progress Notes (Signed)
CT abdomen 08/03/13 on chart, RUQ ultrasound report 08/21/13 on chart, EKG from 04/2013 on chart, stress test 2015 on chart, carotid duplex 2014 on chart, ECHO 2014 on chart, ECHO 2015 on chart, LOV note with lab results 08/01/14 Dr. Woody Seller on chart

## 2014-10-04 NOTE — Progress Notes (Signed)
Chest xray pa and lateral 10-03-2014 dr vyas on chart

## 2014-10-04 NOTE — Patient Instructions (Addendum)
Jamontae Thwaites Oehlert  10/04/2014   Your procedure is scheduled on: Friday 10/05/14  Report to [facility]  Entrance take [facility]  elevators to 3rd floor to  [place] at 05:30 AM.  Call this number if you have problems the morning of surgery [phone]   Remember: ONLY 1 PERSON MAY GO WITH YOU TO SHORT STAY TO GET  READY MORNING OF [place].  Do not eat food or drink liquids :After Midnight.    Take these medicines the morning of surgery with A SIP OF WATER: use/bring inhalers, spiriva, flonase nasal spray, advair, zyrtec                               You may not have any metal on your body including hair pins and              piercings  Do not wear jewelry, make-up, lotions, powders or perfumes, deodorant             Do not wear nail polish.  Do not shave  48 hours prior to surgery.              Men may shave face and neck.  Do not bring valuables to the hospital. [place].  Contacts, dentures or bridgework may not be worn into surgery.  Leave suitcase in the car. After surgery it may be brought to your room.     Patients discharged the day of surgery will not be allowed to drive home.  Name and phone number of your driver:linda Kijowski cell [phone]  Special Instructions: N/A              Please read over the following fact sheets you were given: N/A _____________________________________________________________________           [facility] - Preparing for Surgery Before surgery, you can play an important role.  Because skin is not sterile, your skin needs to be as free of germs as possible.  You can reduce the number of germs on your skin by washing with Dial soap before surgery.   Dial  is an antiseptic cleaner which kills germs and bonds with the skin to continue killing germs even after washing. Please DO NOT use if you have an allergy to Dial  or antibacterial soaps.  If your skin becomes  reddened/irritated stop using the CHG and inform your nurse when you arrive at Short Stay. Do not shave (including legs and underarms) for at least 48 hours prior to the first CHG shower.  You may shave your face/neck. Please follow these instructions carefully:  1.  Shower with Dial Soap the night before surgery and the  morning of Surgery.  2.  If you choose to wash your hair, wash your hair first as usual with your  normal  shampoo.  3.  After you shampoo, rinse your hair and body thoroughly to remove the  shampoo.                            4.  Use Dial as you would any other liquid soap.  You can apply chg directly  to the skin and wash  Gently with a scrungie or clean washcloth.  5.  Apply the Dial Soap to your body ONLY FROM THE NECK DOWN.   Do not use on face/ open                           Wound or open sores. Avoid contact with eyes, ears mouth and genitals (private parts).                       Wash face,  Genitals (private parts) with your normal soap.             6.  Wash thoroughly, paying special attention to the area where your surgery  will be performed.  7.  Thoroughly rinse your body with warm water from the neck down.  8.  DO NOT shower/wash with your normal soap after using and rinsing off  the Dial Soap.                9.  Pat yourself dry with a clean towel.            10.  Wear clean pajamas.            11.  Place clean sheets on your bed the night of your first shower and do not  sleep with pets. Day of Surgery : Do not apply any lotions/deodorants the morning of surgery.  Please wear clean clothes to the hospital/surgery center.  FAILURE TO FOLLOW THESE INSTRUCTIONS MAY RESULT IN THE CANCELLATION OF YOUR SURGERY PATIENT SIGNATURE_________________________________  NURSE SIGNATURE__________________________________  ________________________________________________________________________

## 2014-10-04 NOTE — Progress Notes (Signed)
Cbc and bmet results routed to dr eskridge'

## 2014-10-04 NOTE — Progress Notes (Signed)
Ct abdomen and pelvis 08-03-13 [facility] on chart

## 2014-10-04 NOTE — H&P (Signed)
History of Present Illness                  F/u - PCP Dr. Woody Seller    1-bladder cancer - August 2015 Right HG Ta plus CIS (muscle present and negative) + MMC. Presented with gross hematuria.   Last upper tract imaging: August 2015 negative retrograde pyelograms, June 2015 CT of the a/p - [facility] in [place] - negative upper tracts (no delays)  Last BCG: April 25, 2014 - induction   Last cytology: Apr 2016 atypical  Last cysto: Apr 2016 negative     2- prostate cancer - Intermediate risk - Apr 2012 treated with Cryotherapy and 6 mo neoadjuvant ADT (Trelstar) which shrunk the prostate to 33 g for a T1c, PSA 9.8 - 11.2, 57 g prostate, Gleason 3+3=6 rt base and apex, 3+4=7 rt mid. He needed a foley catheter for about a month post-op.   -Since cryo he feels his penis has shrunk. He has had large hemorrhoids. He had surgery for these. He's had a UTI (e coli) resistant to FQ's and Bactrim but sensitive to cefazolin and nitrofurantoin. He gets foul smelling urine and mild dysuria. He was treated with nitrofurantoin, which was changed to another abx due to rash and itching.   -Sep 2013 PSA 0.67   -Mar 2014 c/o watery stools, PSA 0.66  -Sept 2014 PSA 0.6  -Apr 2016 PSA 1.27     3-weak stream - on tamsulosin. Tried to wean off tamsulosin but had a weak flow and has continued it.     4-  Impotence  He does complain of continued sexual dysfunction. He has a very low libido and trouble getting and maintaining erection. He and his wife are interested in resuming sexual activity. He has tried Viagra in the past and also the vacuum erection device but has not try either since his cryotherapy.   -Mar 2013 T 335  -Sept 2013 continued VED, started Levitra. He has no CP or NTG use  -Mar 2014 Levitra not effective; VED effective but his penis is too "flaky" and he has an "anuerysm" on his penis. Also his penis is cold.  -Sept 2014 he tried some Viagra and it  produced no erection. Discussed IPP and the watch the video.          Apr 2016 interval hx  The patient returns and continue management of high-grade bladder cancer. He completed BCG February 2016. He is well today without dysuria or gross hematuria.     Past Medical History Problems  1. History of Atrial fibrillation (I48.91) 2. History of Benign prostatic hyperplasia with urinary obstruction (N40.1,N13.8) 3. History of Chronic obstructive pulmonary disease (J44.9) 4. History of Disorder of heart rhythm (I49.9) 5. History of bronchitis (Z87.09) 6. History of heartburn (Z87.898) 7. History of hypercholesterolemia (Z86.39)  Surgical History Problems  1. History of Bladder Injection Of Cancer Treatment 2. History of Cystoscopy With Fulguration Medium Lesion (2-5cm) 3. History of Lung Surgery 4. History of Surgery Prostate Cryosurgical Ablation  Current Meds 1. Advair Diskus 250-50 MCG/DOSE MISC;  Therapy: (Recorded:17Dec2007) to Recorded 2. Aspirin 81 MG TABS;  Therapy: (Recorded:17Dec2007) to Recorded 3. Astelin 137 MCG/SPRAY SOLN;  Therapy: (Recorded:13Feb2015) to Recorded 4. Calcium + D TABS;  Therapy: (Recorded:17Dec2007) to Recorded 5. Centrum Silver TABS;  Therapy: (Recorded:17Dec2007) to Recorded 6. Fish Oil CAPS;  Therapy: (Recorded:24Aug2009) to Recorded 7. Flonase 50 MCG/ACT SUSP;  Therapy: (Recorded:13Feb2015) to Recorded 8. Metamucil 1.7 GM WAFR;  Therapy: (Recorded:11Mar2013)  to Recorded 9. NexIUM 40 MG Oral Capsule Delayed Release;  Therapy: (Recorded:11Mar2013) to Recorded 10. Oxygen Use;   Therapy: (Recorded:11Mar2013) to Recorded 11. Simvastatin 40 MG Oral Tablet;   Therapy: (Recorded:11Mar2013) to Recorded 12. Tamsulosin HCl - 0.4 MG Oral Capsule;   Therapy: (Recorded:26Mar2014) to Recorded 13. ZyrTEC Allergy TABS;   Therapy: (Recorded:11Mar2013) to Recorded  Allergies Medication  1. Nitrofurantoin CAPS 2. Omnicef CAPS  Family  History Problems  1. Family history of Colon Cancer : Mother 2. Family history of Death In The Family Father : Father   Age 18 3. Family history of Death In The Family Mother : Mother   Colon cancerAge 21 4. Family history of Family Health Status Number Of Children   1 daughter 5. Family history of Tuberculosis : Sister  Social History Problems  1. Denied: Alcohol Use 2. Caffeine Use   Coffee 2-3/day 3. Former smoker [phone]) 4. Marital History - Currently Married 5. Occupation:   Retired 50. Tobacco Use   1-1 1/2 packs per day 57 yrs., quit 6 months ago  Vitals Vital Signs [Data Includes: Last 1 Day]  Recorded: 13Jul2016 01:34PM  Blood Pressure: 97 / 60 Temperature: 96.8 F Heart Rate: 80  Physical Exam Constitutional: Well nourished and well developed . No acute distress.  Pulmonary: No respiratory distress and normal respiratory rhythm and effort.  Cardiovascular: Heart rate and rhythm are normal . No peripheral edema.  Neuro/Psych:. Mood and affect are appropriate.    Results/Data Urine [Data Includes: Last 1 Day]   [id number]  COLOR YELLOW   APPEARANCE CLEAR   SPECIFIC GRAVITY 1.010   pH 6.0   GLUCOSE NEG mg/dL  BILIRUBIN NEG   KETONE NEG mg/dL  BLOOD MOD   PROTEIN NEG mg/dL  UROBILINOGEN 0.2 mg/dL  NITRITE NEG   LEUKOCYTE ESTERASE TRACE   SQUAMOUS EPITHELIAL/HPF NONE SEEN   WBC 0-2 WBC/hpf  RBC 3-6 RBC/hpf  BACTERIA NONE SEEN   CRYSTALS NONE SEEN   CASTS Hyaline casts noted    Procedure  Procedure: Cystoscopy   Indication: History of Urothelial Carcinoma.  Informed Consent: Risks, benefits, and potential adverse events were discussed and informed consent was obtained from the patient.  Prep: The patient was prepped with betadine.  Antibiotic prophylaxis: Ciprofloxacin.  Procedure Note:  Urethral meatus:. No abnormalities.  Anterior urethra: No abnormalities.  Prostatic urethra: No abnormalities.  Bladder: Visulization was clear. The  ureteral orifices were in the normal anatomic position bilaterally and had clear efflux of urine. A systematic survey of the bladder demonstrated no bladder tumors or stones. The mucosa was smooth without abnormalities. Multiple tumors were identified in the bladder. These were flat patchy areas one posteriorly, one right trigone, one right bladder wall. About 2 cm each. The patient tolerated the procedure well.  Complications: None.    Assessment Assessed  1. Prostate cancer (C61) 2. Malignant neoplasm of lateral wall of urinary bladder (C67.2) 3. Microscopic hematuria (R31.2)  Plan Health Maintenance  1. UA With REFLEX; [Do Not Release]; Status:Complete;   Done: [id number] 01:15PM Malignant neoplasm of lateral wall of urinary bladder  2. Follow-up Schedule Surgery Office  Follow-up  Status: Hold For - Appointment   Requested for: [phone] Microscopic hematuria  3. AU CT-HEMATURIA PROTOCOL; Status:Canceled - Appointment,PreCert,Date of  Service,Print;  4. BUN & CREATININE; Status:Canceled - Specimen/Data Collection,Appointment;  Prostate cancer  5. PSA; Status:Canceled - Specimen/Data Collection,Appointment;   Discussion/Summary     High-grade TA bladder cancer with CIS - he has some evidence of  recurrence on cystoscopy today. I'll set him up for cystoscopy, possible TURBT, bilateral retrograde pyelograms. The right tumor encroaches the trigone and he might need a right ureteral stent and we discussed it.     PCa - PSA up some. Repeat PSA around October 2016.     Signatures Electronically signed by : Festus Aloe, M.D.; Sep 12 2014  2:20PM EST

## 2014-10-05 ENCOUNTER — Ambulatory Visit (HOSPITAL_COMMUNITY): Payer: Medicare Other | Admitting: Anesthesiology

## 2014-10-05 ENCOUNTER — Encounter (HOSPITAL_COMMUNITY): Payer: Self-pay | Admitting: *Deleted

## 2014-10-05 ENCOUNTER — Ambulatory Visit (HOSPITAL_COMMUNITY): Payer: Medicare Other

## 2014-10-05 ENCOUNTER — Ambulatory Visit (HOSPITAL_COMMUNITY)
Admission: RE | Admit: 2014-10-05 | Discharge: 2014-10-05 | Disposition: A | Discharge status: Home / Self Care | Payer: Medicare Other | Source: Ambulatory Visit | Attending: Urology | Admitting: Urology

## 2014-10-05 ENCOUNTER — Encounter (HOSPITAL_COMMUNITY)
Admission: RE | Disposition: A | Discharge status: Home / Self Care | Payer: Self-pay | Source: Ambulatory Visit | Attending: Urology

## 2014-10-05 DIAGNOSIS — C672 Malignant neoplasm of lateral wall of bladder: Secondary | ICD-10-CM | POA: Insufficient documentation

## 2014-10-05 DIAGNOSIS — N529 Male erectile dysfunction, unspecified: Secondary | ICD-10-CM | POA: Insufficient documentation

## 2014-10-05 DIAGNOSIS — Z8546 Personal history of malignant neoplasm of prostate: Secondary | ICD-10-CM | POA: Insufficient documentation

## 2014-10-05 DIAGNOSIS — C678 Malignant neoplasm of overlapping sites of bladder: Secondary | ICD-10-CM

## 2014-10-05 DIAGNOSIS — E780 Pure hypercholesterolemia: Secondary | ICD-10-CM | POA: Insufficient documentation

## 2014-10-05 DIAGNOSIS — R312 Other microscopic hematuria: Secondary | ICD-10-CM | POA: Insufficient documentation

## 2014-10-05 DIAGNOSIS — E119 Type 2 diabetes mellitus without complications: Secondary | ICD-10-CM | POA: Insufficient documentation

## 2014-10-05 DIAGNOSIS — C674 Malignant neoplasm of posterior wall of bladder: Secondary | ICD-10-CM | POA: Insufficient documentation

## 2014-10-05 DIAGNOSIS — Z79899 Other long term (current) drug therapy: Secondary | ICD-10-CM | POA: Insufficient documentation

## 2014-10-05 DIAGNOSIS — J449 Chronic obstructive pulmonary disease, unspecified: Secondary | ICD-10-CM | POA: Insufficient documentation

## 2014-10-05 DIAGNOSIS — Z87891 Personal history of nicotine dependence: Secondary | ICD-10-CM | POA: Insufficient documentation

## 2014-10-05 DIAGNOSIS — I272 Other secondary pulmonary hypertension: Secondary | ICD-10-CM | POA: Insufficient documentation

## 2014-10-05 DIAGNOSIS — C670 Malignant neoplasm of trigone of bladder: Secondary | ICD-10-CM | POA: Diagnosis not present

## 2014-10-05 DIAGNOSIS — D090 Carcinoma in situ of bladder: Secondary | ICD-10-CM | POA: Diagnosis not present

## 2014-10-05 DIAGNOSIS — D494 Neoplasm of unspecified behavior of bladder: Secondary | ICD-10-CM

## 2014-10-05 DIAGNOSIS — Z7982 Long term (current) use of aspirin: Secondary | ICD-10-CM | POA: Insufficient documentation

## 2014-10-05 HISTORY — PX: CYSTOSCOPY W/ RETROGRADES: SHX1426

## 2014-10-05 HISTORY — PX: CYSTOSCOPY WITH BIOPSY: SHX5122

## 2014-10-05 SURGERY — CYSTOSCOPY, WITH BIOPSY
Anesthesia: General

## 2014-10-05 MED ORDER — CEFAZOLIN SODIUM-DEXTROSE 2-3 GM-% IV SOLR
2.0000 g | INTRAVENOUS | Status: AC
  Administered 2014-10-05: 2 g via INTRAVENOUS

## 2014-10-05 MED ORDER — EPHEDRINE SULFATE 50 MG/ML IJ SOLN
INTRAMUSCULAR | Status: DC | PRN
  Administered 2014-10-05 (×2): 5 mg via INTRAVENOUS

## 2014-10-05 MED ORDER — IOHEXOL 300 MG/ML  SOLN
INTRAMUSCULAR | Status: DC | PRN
  Administered 2014-10-05: 10 mL via URETHRAL

## 2014-10-05 MED ORDER — ONDANSETRON HCL 4 MG/2ML IJ SOLN
INTRAMUSCULAR | Status: DC | PRN
  Administered 2014-10-05: 4 mg via INTRAVENOUS

## 2014-10-05 MED ORDER — LACTATED RINGERS IV SOLN
INTRAVENOUS | Status: DC | PRN
  Administered 2014-10-05: 07:00:00 via INTRAVENOUS

## 2014-10-05 MED ORDER — STERILE WATER FOR IRRIGATION IR SOLN
Status: DC | PRN
  Administered 2014-10-05: 6000 mL

## 2014-10-05 MED ORDER — DEXAMETHASONE SODIUM PHOSPHATE 10 MG/ML IJ SOLN
INTRAMUSCULAR | Status: AC
  Filled 2014-10-05: qty 1

## 2014-10-05 MED ORDER — ONDANSETRON HCL 4 MG/2ML IJ SOLN
INTRAMUSCULAR | Status: AC
  Filled 2014-10-05: qty 2

## 2014-10-05 MED ORDER — LIDOCAINE HCL (CARDIAC) 20 MG/ML IV SOLN
INTRAVENOUS | Status: DC | PRN
  Administered 2014-10-05: 100 mg via INTRAVENOUS

## 2014-10-05 MED ORDER — PROPOFOL 10 MG/ML IV BOLUS
INTRAVENOUS | Status: DC | PRN
  Administered 2014-10-05: 130 mg via INTRAVENOUS

## 2014-10-05 MED ORDER — ONDANSETRON HCL 4 MG/2ML IJ SOLN
4.0000 mg | Freq: Once | INTRAMUSCULAR | Status: DC | PRN
Start: 2014-10-05 — End: 2014-10-05

## 2014-10-05 MED ORDER — FENTANYL CITRATE (PF) 100 MCG/2ML IJ SOLN
INTRAMUSCULAR | Status: AC
  Filled 2014-10-05: qty 4

## 2014-10-05 MED ORDER — HYDROMORPHONE HCL 1 MG/ML IJ SOLN: 0.2500 mg | INTRAMUSCULAR | Status: DC | PRN

## 2014-10-05 MED ORDER — CEFAZOLIN SODIUM-DEXTROSE 2-3 GM-% IV SOLR
INTRAVENOUS | Status: AC
  Filled 2014-10-05: qty 50

## 2014-10-05 MED ORDER — MEPERIDINE HCL 50 MG/ML IJ SOLN: 6.2500 mg | INTRAMUSCULAR | Status: DC | PRN

## 2014-10-05 MED ORDER — PROPOFOL 10 MG/ML IV BOLUS
INTRAVENOUS | Status: AC
  Filled 2014-10-05: qty 20

## 2014-10-05 MED ORDER — DEXAMETHASONE SODIUM PHOSPHATE 10 MG/ML IJ SOLN
INTRAMUSCULAR | Status: DC | PRN
  Administered 2014-10-05: 10 mg via INTRAVENOUS

## 2014-10-05 MED ORDER — SODIUM CHLORIDE 0.9 % IR SOLN
Status: DC | PRN
  Administered 2014-10-05: 3000 mL

## 2014-10-05 MED ORDER — FENTANYL CITRATE (PF) 100 MCG/2ML IJ SOLN
INTRAMUSCULAR | Status: DC | PRN
  Administered 2014-10-05: 25 ug via INTRAVENOUS

## 2014-10-05 MED ORDER — FLUCONAZOLE 150 MG PO TABS: 150.0000 mg | ORAL_TABLET | Freq: Once | ORAL | Status: DC

## 2014-10-05 SURGICAL SUPPLY — 13 items
BAG URINE DRAINAGE (UROLOGICAL SUPPLIES) IMPLANT
BAG URO CATCHER STRL LF (DRAPE) ×4 IMPLANT
CATH URET 5FR 28IN CONE TIP (BALLOONS) ×2
CATH URET 5FR 70CM CONE TIP (BALLOONS) ×1 IMPLANT
DRAPE CAMERA CLOSED 9X96 (DRAPES) ×1 IMPLANT
GLOVE BIOGEL M STRL SZ7.5 (GLOVE) ×10 IMPLANT
GOWN STRL REUS W/TWL XL LVL3 (GOWN DISPOSABLE) ×7 IMPLANT
LOOP CUT BIPOLAR 24F LRG (ELECTROSURGICAL) ×2 IMPLANT
MANIFOLD NEPTUNE II (INSTRUMENTS) ×4 IMPLANT
PACK CYSTO (CUSTOM PROCEDURE TRAY) ×4 IMPLANT
SYRINGE IRR TOOMEY STRL 70CC (SYRINGE) ×3 IMPLANT
TUBING CONNECTING 10 (TUBING) ×3 IMPLANT
TUBING CONNECTING 10' (TUBING) ×1

## 2014-10-05 NOTE — Interval H&P Note (Signed)
History and Physical Interval Note:  10/05/2014 7:24 AM  Fred Newton  has presented today for surgery, with the diagnosis of BLADDER CANCER OVERLAPPING  The various methods of treatment have been discussed with the patient and family. After consideration of risks, benefits and other options for treatment, the patient has consented to  Procedure(s) with comments: CYSTOSCOPY WITH BIOPSY (N/A) - 90 MINS REQUESTED FOR THIS CASE  **POSSIBLE URETERAL STENT**   TRANSURETHRAL RESECTION OF BLADDER TUMOR (TURBT) (N/A) - POSSIBLE  CYSTOSCOPY WITH BILATERAL RETROGRADE PYELOGRAM (Bilateral) - C-ARM     as a surgical intervention .  The patient's history has been reviewed, patient examined, no change in status, stable for surgery.  I have reviewed the patient's chart and labs.  Questions were answered to the patient's satisfaction.  He also complains of jock itch and peri-anal itching. No dysuria, gross hematuria or fever.    [name]

## 2014-10-05 NOTE — Op Note (Signed)
Preoperative diagnosis: Bladder cancer Postoperative diagnosis: Bladder cancer  Procedure: Exam under anesthesia, Cystoscopy with bladder biopsy and fulguration greater than 5 cm, Bilateral retrograde pyelogram with interpretation  Surgeon: Miki Blank  Type of anesthesia: Gen.  Indication for procedure: 81 year old white male with a history of high-grade TA urothelial carcinoma and CIS. He had recurrence and was brought to the OR for biopsy fulguration and retrograde.  Findings: Exam under anesthesia revealed a normal penis without mass or lesion. Circumcised. Mild erythema around the testicle and intertriginous folds consistent with tinea cruris. The testicles were descended bilaterally and palpably normal. On digital rectal exam the prostate was small and smooth without hard area or nodule. There were no perianal lesions, induration, drainage or fluctuance.  On cystoscopy the urethra appeared normal, the prostatic urethra was unremarkable. The trigone and ureteral orifices were in their normal orthotopic position. On the right posterior bladder wall there was early papillary tumor with flat papillary tumor. It appeared superficial as it simply would brush off the mucosa.  There was another patch of similar tumor encroaching on the right ureteral orifice on the right trigone.  There was a final patch of tumor on the right anterior bladder wall. The remainder of the bladder was unremarkable. There was no stone or foreign body in the bladder.  Right retrograde pyelogram-this outlined a single ureter single collecting system without filling defect, stricture or dilation.  Left retrograde pyelogram-this outlined a single ureter single collecting system unit without filling defect, stricture or dilation.  Both sides E fluxed and washed out briskly.  Description of procedure: After consent was obtained the patient was brought to the operating room. He placed in lithotomy position. An exam under  anesthesia was performed. He was prepped and draped in the usual sterile fashion. A timeout was performed to confirm the patient and procedure. The cystoscope was passed per urethra. The bladder was carefully inspected. I used a 7 [place] the and the 9 [place] biopsy forceps to biopsy the right posterior. The tumor was really to flat and superficial to require bipolar resection. After getting several representative specimens the entire area was fulgurated. All visible tumor was eradicated.  I was able to locate the right ureteral orifice with clear efflux from the right UO. I carefully fulgurated around to eradicate the tumor and biopsy the right trigone and then fulgurated the remainder of the tumor. Again efflux remained good from the right ureteral orifice.  The right anterior was then biopsied again with the 9 French flexible biopsy forceps. This patch was fulgurated and adequate hemostasis was insured. All areas had adequate hemostasis under low pressure.  I then copiously irrigated the bladder multiple times to clear all and any debris. With clear irrigation the right ureteral orifice was cannulated with a cone-tipped catheter and retrograde injection of contrast was performed. The left ureteral orifice was cannulated with a cone-tipped catheter and retrograde injection of contrast was performed. Scout and spot images were obtained throughout the procedure.  Again hemostasis remained good. The bladder was drained and scope removed. The patient was awakened taken to recovery room in stable condition.  Complications: None  Blood loss: Minimal  Specimens: #1 right posterior bladder biopsy #2 right trigone biopsy #3 right anterior biopsy Bladder biopsy specimens to pathology  Drains: None  Disposition: Patient stable to PACU

## 2014-10-05 NOTE — Anesthesia Preprocedure Evaluation (Addendum)
Anesthesia Evaluation  Patient identified by MRN, date of birth, ID band Patient awake    Reviewed: Allergy & Precautions, NPO status , Patient's Chart, lab work & pertinent test results  Airway Mallampati: I  TM Distance: >3 FB Neck ROM: Full    Dental   Pulmonary shortness of breath and with exertion, COPDformer smoker,    Pulmonary exam normal       Cardiovascular Normal cardiovascular exam+ Valvular Problems/Murmurs AS and MR + Systolic murmurs ECHO 76/80 at [facility] Mod AS Peak grad=35, Mean =20 Mild Pulm HTN Valve area 1.4 EF 55%   Neuro/Psych    GI/Hepatic GERD-  Medicated and Controlled,  Endo/Other  diabetes  Renal/GU      Musculoskeletal   Abdominal   Peds  Hematology   Anesthesia Other Findings   Reproductive/Obstetrics                            Anesthesia Physical Anesthesia Plan  ASA: III  Anesthesia Plan: General   Post-op Pain Management:    Induction: Intravenous  Airway Management Planned: LMA  Additional Equipment:   Intra-op Plan:   Post-operative Plan: Extubation in OR  Informed Consent: I have reviewed the patients History and Physical, chart, labs and discussed the procedure including the risks, benefits and alternatives for the proposed anesthesia with the patient or authorized representative who has indicated his/her understanding and acceptance.     Plan Discussed with: CRNA and Surgeon  Anesthesia Plan Comments:         Anesthesia Quick Evaluation

## 2014-10-05 NOTE — Discharge Instructions (Signed)
Cystoscopy, Care After Refer to this sheet in the next few weeks. These instructions provide you with information on caring for yourself after your procedure. Your caregiver may also give you more specific instructions. Your treatment has been planned according to current medical practices, but problems sometimes occur. Call your caregiver if you have any problems or questions after your procedure. HOME CARE INSTRUCTIONS  Things you can do to ease any discomfort after your procedure include:  Drinking enough water and fluids to keep your urine clear or pale yellow.  Taking a warm bath to relieve any burning feelings. SEEK IMMEDIATE MEDICAL CARE IF:   You have an increase in blood in your urine.  You notice blood clots in your urine.  You have difficulty passing urine.  You have the chills.  You have abdominal pain.  You have a fever or persistent symptoms for more than 2-3 days.  You have a fever and your symptoms suddenly get worse. MAKE SURE YOU:   Understand these instructions.  Will watch your condition.  Will get help right away if you are not doing well or get worse. Document Released: 09/05/2004 Document Revised: 10/19/2012 Document Reviewed: 08/10/2011 [facility] Patient Information 2015 [place], [place]. This information is not intended to replace advice given to you by your health care provider. Make sure you discuss any questions you have with your health care provider.                                                                                                                   PATIENT INSTRUCTIONS POST-ANESTHESIA  IMMEDIATELY FOLLOWING SURGERY:  Do not drive or operate machinery for the first twenty four hours after surgery.  Do not make any important decisions for twenty four hours after surgery or while taking narcotic pain medications or sedatives.  If you develop intractable nausea and vomiting or a severe headache please notify your doctor  immediately.  FOLLOW-UP:  Please make an appointment with your surgeon as instructed. You do not need to follow up with anesthesia unless specifically instructed to do so.  WOUND CARE INSTRUCTIONS (if applicable):  Keep a dry clean dressing on the anesthesia/puncture wound site if there is drainage.  Once the wound has quit draining you may leave it open to air.  Generally you should leave the bandage intact for twenty four hours unless there is drainage.  If the epidural site drains for more than 36-48 hours please call the anesthesia department.  QUESTIONS?:  Please feel free to call your physician or the hospital operator if you have any questions, and they will be happy to assist you.

## 2014-10-05 NOTE — Transfer of Care (Signed)
Immediate Anesthesia Transfer of Care Note  Patient: Fred Newton  Procedure(s) Performed: Procedure(s) with comments: CYSTOSCOPY WITH BIOPSY (N/A) - 85 MINS REQUESTED FOR THIS CASE  **POSSIBLE URETERAL STENT**   CYSTOSCOPY WITH BILATERAL RETROGRADE PYELOGRAM (Bilateral) - C-ARM      Patient Location: PACU  Anesthesia Type:General  Level of Consciousness: awake, alert  and oriented  Airway & Oxygen Therapy: Patient Spontanous Breathing and Patient connected to face mask oxygen  Post-op Assessment: Report given to RN and Post -op Vital signs reviewed and stable  Post vital signs: Reviewed and stable  Last Vitals:  Filed Vitals:   10/05/14 0849  BP:   Pulse: 71  Temp:   Resp: 7    Complications: No apparent anesthesia complications

## 2014-10-05 NOTE — Anesthesia Postprocedure Evaluation (Signed)
Anesthesia Post Note  Patient: Fred Newton  Procedure(s) Performed: Procedure(s) (LRB): CYSTOSCOPY WITH BIOPSY (N/A) CYSTOSCOPY WITH BILATERAL RETROGRADE PYELOGRAM (Bilateral)  Anesthesia type: general  Patient location: PACU  Post pain: Pain level controlled  Post assessment: Patient's Cardiovascular Status Stable  Last Vitals:  Filed Vitals:   10/05/14 1007  BP: 141/69  Pulse: 62  Temp:   Resp: 16    Post vital signs: Reviewed and stable  Level of consciousness: sedated  Complications: No apparent anesthesia complications

## 2014-10-08 ENCOUNTER — Encounter (HOSPITAL_COMMUNITY): Payer: Self-pay | Admitting: Urology

## 2014-10-16 ENCOUNTER — Encounter: Payer: Self-pay | Admitting: *Deleted

## 2014-11-16 ENCOUNTER — Encounter: Payer: Self-pay | Admitting: Cardiovascular Disease

## 2014-11-19 ENCOUNTER — Encounter: Payer: Self-pay | Admitting: Cardiovascular Disease

## 2015-05-08 ENCOUNTER — Other Ambulatory Visit: Payer: Self-pay | Admitting: Urology

## 2015-05-08 MED ORDER — EPIRUBICIN HCL CHEMO IV INJECTION 50 MG/25ML: 50.0000 mg | Freq: Once | INTRAVENOUS | Status: AC

## 2015-05-22 NOTE — Patient Instructions (Addendum)
Rudolphe Testerman Yakubov  05/22/2015   Your procedure is scheduled on: 05-28-15  Report to [facility]  Entrance take [facility]  elevators to 3rd floor to  [place] at 900  AM.  Call this number if you have problems the morning of surgery [phone]   Remember: ONLY 1 PERSON MAY GO WITH YOU TO SHORT STAY TO GET  READY MORNING OF [place].  Do not eat food or drink liquids :After Midnight.     Take these medicines the morning of surgery with A SIP OF WATER: ALBUTEROL INHALER IF NEEDED AND BRING INHALER, CERTRIZINE (ZYRTEC), RESTASIS EYE DROP IF NEEDED, FLONASES NASAL SPRAY IF NEEDED, ADVAIR, SPIRIVA DO NOT TAKE ANY DIABETIC MEDICATIONS DAY OF YOUR SURGERY                               You may not have any metal on your body including hair pins and              piercings  Do not wear jewelry, make-up, lotions, powders or perfumes, deodorant             Do not wear nail polish.  Do not shave  48 hours prior to surgery.              Men may shave face and neck.   Do not bring valuables to the hospital. [place].  Contacts, dentures or bridgework may not be worn into surgery.  Leave suitcase in the car. After surgery it may be brought to your room.     Patients discharged the day of surgery will not be allowed to drive home.  Name and phone number of your driver: [place] [phone]  Special Instructions: N/A              Please read over the following fact sheets you were given: _____________________________________________________________________             [facility] - Preparing for Surgery Before surgery, you can play an important role.  Because skin is not sterile, your skin needs to be as free of germs as possible.  You can reduce the number of germs on your skin by washing with CHG (chlorahexidine gluconate) soap before surgery.  CHG is an antiseptic cleaner which kills germs and bonds with  the skin to continue killing germs even after washing. Please DO NOT use if you have an allergy to CHG or antibacterial soaps.  If your skin becomes reddened/irritated stop using the CHG and inform your nurse when you arrive at Short Stay. Do not shave (including legs and underarms) for at least 48 hours prior to the first CHG shower.  You may shave your face/neck. Please follow these instructions carefully:  1.  Shower with GOLD DIAL SOAP Soap the night before surgery and the  morning of Surgery.  2.  If you choose to wash your hair, wash your hair first as usual with your  normal  shampoo.  3.  After you shampoo, rinse your hair and body thoroughly to remove the  shampoo.  4.  Use  GOLD DIAL SOAP as you would any other liquid soap.  You can apply chg directly  to the skin and wash                       Gently with a scrungie or clean washcloth.  5.  Apply the CHG Soap to your body ONLY FROM THE NECK DOWN.   Do not use on face/ open                           Wound or open sores. Avoid contact with eyes, ears mouth and genitals (private parts).                       Wash face,  Genitals (private parts) with your normal soap.             6.  Wash thoroughly, paying special attention to the area where your surgery  will be performed.  7.  Thoroughly rinse your body with warm water from the neck down.  8.  DO NOT shower/wash with your normal soap after using and rinsing off  the CHG Soap.                9.  Pat yourself dry with a clean towel.            10.  Wear clean pajamas.            11.  Place clean sheets on your bed the night of your first shower and do not  sleep with pets. Day of Surgery : Do not apply any lotions/deodorants the morning of surgery.  Please wear clean clothes to the hospital/surgery center.  FAILURE TO FOLLOW THESE INSTRUCTIONS MAY RESULT IN THE CANCELLATION OF YOUR SURGERY PATIENT SIGNATURE_________________________________  NURSE  SIGNATURE__________________________________  ________________________________________________________________________

## 2015-05-22 NOTE — Progress Notes (Signed)
EKG 10-04-14 EPIC

## 2015-05-27 ENCOUNTER — Encounter (HOSPITAL_COMMUNITY): Payer: Self-pay

## 2015-05-27 ENCOUNTER — Encounter (HOSPITAL_COMMUNITY)
Admission: RE | Admit: 2015-05-27 | Discharge: 2015-05-27 | Disposition: A | Discharge status: Home / Self Care | Payer: Medicare Other | Source: Ambulatory Visit | Attending: Urology | Admitting: Urology

## 2015-05-27 DIAGNOSIS — N189 Chronic kidney disease, unspecified: Secondary | ICD-10-CM | POA: Diagnosis not present

## 2015-05-27 DIAGNOSIS — I4891 Unspecified atrial fibrillation: Secondary | ICD-10-CM | POA: Diagnosis not present

## 2015-05-27 DIAGNOSIS — Z87891 Personal history of nicotine dependence: Secondary | ICD-10-CM | POA: Diagnosis not present

## 2015-05-27 DIAGNOSIS — Z79899 Other long term (current) drug therapy: Secondary | ICD-10-CM | POA: Diagnosis not present

## 2015-05-27 DIAGNOSIS — E7800 Pure hypercholesterolemia, unspecified: Secondary | ICD-10-CM | POA: Diagnosis not present

## 2015-05-27 DIAGNOSIS — N401 Enlarged prostate with lower urinary tract symptoms: Secondary | ICD-10-CM | POA: Diagnosis not present

## 2015-05-27 DIAGNOSIS — R3912 Poor urinary stream: Secondary | ICD-10-CM | POA: Diagnosis not present

## 2015-05-27 DIAGNOSIS — C672 Malignant neoplasm of lateral wall of bladder: Secondary | ICD-10-CM | POA: Diagnosis present

## 2015-05-27 DIAGNOSIS — N529 Male erectile dysfunction, unspecified: Secondary | ICD-10-CM | POA: Diagnosis not present

## 2015-05-27 DIAGNOSIS — Z7982 Long term (current) use of aspirin: Secondary | ICD-10-CM | POA: Diagnosis not present

## 2015-05-27 DIAGNOSIS — J449 Chronic obstructive pulmonary disease, unspecified: Secondary | ICD-10-CM | POA: Diagnosis not present

## 2015-05-27 DIAGNOSIS — N138 Other obstructive and reflux uropathy: Secondary | ICD-10-CM | POA: Diagnosis not present

## 2015-05-27 DIAGNOSIS — I2510 Atherosclerotic heart disease of native coronary artery without angina pectoris: Secondary | ICD-10-CM | POA: Diagnosis not present

## 2015-05-27 DIAGNOSIS — K219 Gastro-esophageal reflux disease without esophagitis: Secondary | ICD-10-CM | POA: Diagnosis not present

## 2015-05-27 DIAGNOSIS — I739 Peripheral vascular disease, unspecified: Secondary | ICD-10-CM | POA: Diagnosis not present

## 2015-05-27 DIAGNOSIS — Z7951 Long term (current) use of inhaled steroids: Secondary | ICD-10-CM | POA: Diagnosis not present

## 2015-05-27 DIAGNOSIS — Z8546 Personal history of malignant neoplasm of prostate: Secondary | ICD-10-CM | POA: Diagnosis not present

## 2015-05-27 HISTORY — DX: Unspecified hearing loss, unspecified ear: H91.90

## 2015-05-27 HISTORY — DX: Unspecified dementia, unspecified severity, without behavioral disturbance, psychotic disturbance, mood disturbance, and anxiety: F03.90

## 2015-05-27 HISTORY — DX: Atherosclerotic heart disease of native coronary artery without angina pectoris: I25.10

## 2015-05-27 LAB — CBC
HCT: 33.3 % — ABNORMAL LOW (ref 39.0–52.0)
HEMOGLOBIN: 11.5 g/dL — AB (ref 13.0–17.0)
MCH: 32.2 pg (ref 26.0–34.0)
MCHC: 34.5 g/dL (ref 30.0–36.0)
MCV: 93.3 fL (ref 78.0–100.0)
Platelets: 239 10*3/uL (ref 150–400)
RBC: 3.57 MIL/uL — ABNORMAL LOW (ref 4.22–5.81)
RDW: 13.3 % (ref 11.5–15.5)
WBC: 9.8 10*3/uL (ref 4.0–10.5)

## 2015-05-27 LAB — BASIC METABOLIC PANEL
Anion gap: 8 (ref 5–15)
BUN: 25 mg/dL — AB (ref 6–20)
CALCIUM: 9.1 mg/dL (ref 8.9–10.3)
CHLORIDE: 106 mmol/L (ref 101–111)
CO2: 25 mmol/L (ref 22–32)
CREATININE: 1.5 mg/dL — AB (ref 0.61–1.24)
GFR calc non Af Amer: 41 mL/min — ABNORMAL LOW (ref >60)
GFR, EST AFRICAN AMERICAN: 48 mL/min — AB (ref >60)
Glucose, Bld: 105 mg/dL — ABNORMAL HIGH (ref 65–99)
Potassium: 5.2 mmol/L — ABNORMAL HIGH (ref 3.5–5.1)
Sodium: 139 mmol/L (ref 135–145)

## 2015-05-27 NOTE — Progress Notes (Signed)
CHEST XRAY 2 VIEW 10-03-14 [place] ON CHART ECHO [place] 03-13-15 ON CHART STRESS TEST 05-31-13 [place] ON CHART

## 2015-05-27 NOTE — H&P (Signed)
History of Present Illness                        F/u - PCP Dr. 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 Presented with gross hematuria.   -Aug 2016 HGTa (right posterior) and CIS (right anterior)   Last upper tract imaging: Aug 2016 negative RGP's; August 2015 neg RGP; June 2015 CT a/p -[facility] in [place] - negative upper tracts (no delays)  Last BCG: Oct 2016 1/3 BCG with Intron x 6   Last cytology: Apr 2016 atypical  Last cysto: Dec 2016 - nl     2- prostate cancer - Intermediate risk - Apr 2012 treated with Cryotherapy and 6 mo neoadjuvant ADT (Trelstar) which shrunk the prostate to 33 g for a T1c, PSA 9.8 - 11.2, 57 g prostate, Gleason 3+3=6 rt base and apex, 3+4=7 rt mid. He needed a foley catheter for about a month post-op.   -Since cryo he feels his penis has shrunk.   -Sep 2013 PSA 0.67   -Mar 2014 c/o watery stools, PSA 0.66  -Sept 2014 PSA 0.6  -Apr 2016 PSA 1.27   -Aug 2016 normal DRE    3-weak stream - on tamsulosin. Tried to wean off tamsulosin but had a weak flow and has continued it.     4- Impotence -   He does complain of continued sexual dysfunction. He has a very low libido and trouble getting and maintaining erection. He and his wife are interested in resuming sexual activity. He has tried Viagra in the past and also the vacuum erection device but has not try either since his cryotherapy.   -Mar 2013 T 335  -Sept 2013 continued VED, started Levitra. He has no CP or NTG use  -Mar 2014 Levitra not effective; VED effective but his penis is too "flaky" and he has an "anuerysm" on his penis. Also his penis is cold.  -Sept 2014 he tried some Viagra and it produced no erection. Discussed IPP and the watch the video  -Dec 2016 - wants IPP - Sometimes he gets a very strong libido but can't act on it.      Mar 2017 int hx      Past Medical History Problems   1. History of Atrial fibrillation (I48.91) 2. History of Benign prostatic hyperplasia with urinary obstruction (N40.1,N13.8) 3. History of Chronic obstructive pulmonary disease (J44.9) 4. History of Disorder of heart rhythm (I49.9) 5. History of bronchitis (Z87.09) 6. History of heartburn (Z87.898) 7. History of hypercholesterolemia (Z86.39)  Surgical History Problems  1. History of Bladder Injection Of Cancer Treatment 2. History of Cystoscopy With Fulguration Medium Lesion (2-5cm) 3. History of Cystoscopy With Fulguration Minor Lesion (Under 44mm) 4. History of Lung Surgery 5. History of Surgery Prostate Cryosurgical Ablation  Current Meds 1. Advair Diskus 250-50 MCG/DOSE MISC;  Therapy: (Recorded:17Dec2007) to Recorded 2. Aspirin 81 MG TABS;  Therapy: (Recorded:17Dec2007) to Recorded 3. Calcium + D TABS;  Therapy: (Recorded:17Dec2007) to Recorded 4. Centrum Silver TABS;  Therapy: (Recorded:17Dec2007) to Recorded 5. Fish Oil CAPS;  Therapy: (Recorded:24Aug2009) to Recorded 6. Flonase 50 MCG/ACT SUSP;  Therapy: (Recorded:13Feb2015) to Recorded 7. Metamucil 1.7 GM WAFR;  Therapy: (Recorded:11Mar2013) to Recorded 8. NexIUM 40 MG Oral Capsule Delayed Release;  Therapy: (Recorded:11Mar2013) to Recorded 9. Oxygen Use;  Therapy: (Recorded:11Mar2013) to Recorded 10. Simvastatin 40 MG Oral Tablet;   Therapy: (Recorded:11Mar2013)  to Recorded 11. Spiriva HandiHaler CAPS;   Therapy: (Recorded:10Aug2016) to Recorded 12. Tamsulosin HCl - 0.4 MG Oral Capsule;   Therapy: (Recorded:26Mar2014) to Recorded 13. ZyrTEC Allergy TABS;   Therapy: (Recorded:11Mar2013) to Recorded  Allergies Medication  1. Nitrofurantoin CAPS 2. Omnicef CAPS  Family History Problems  1. Family history of Colon Cancer : Mother 2. Family history of Death In The Family Father : Father   Age 23 3. Family history of Death In The Family Mother : Mother   Colon cancerAge 9 4. Family history of Family  Health Status Number Of Children   1 daughter 5. Family history of Tuberculosis : Sister  Social History Problems  1. Denied: Alcohol Use (History) 2. Caffeine Use   Coffee 2-3/day 3. Former smoker [id number]) 4. Marital History - Currently Married 5. Occupation:   Retired 12. Tobacco Use   1-1 1/2 packs per day 57 yrs., quit 6 months ago  Vitals Vital Signs [Data Includes: Last 1 Day]  Recorded: [id number] 02:09PM  Blood Pressure: 113 / 60 Temperature: 97.7 F Heart Rate: 72  Physical Exam Constitutional: Well nourished and well developed . No acute distress.  Neuro/Psych:. Mood and affect are appropriate.    Results/Data Urine [Data Includes: Last 1 Day]   [id number]  COLOR YELLOW   APPEARANCE CLEAR   SPECIFIC GRAVITY 1.020   pH 5.5   GLUCOSE NEGATIVE   BILIRUBIN NEGATIVE   KETONE NEGATIVE   BLOOD TRACE   PROTEIN NEGATIVE   NITRITE NEGATIVE   LEUKOCYTE ESTERASE NEGATIVE   SQUAMOUS EPITHELIAL/HPF 0-5 HPF  WBC 0-5 WBC/HPF  RBC 3-10 RBC/HPF  BACTERIA NONE SEEN HPF  CRYSTALS NONE SEEN HPF  CASTS NONE SEEN LPF  Yeast NONE SEEN HPF   Procedure  Procedure: Cystoscopy   Informed Consent: Risks, benefits, and potential adverse events were discussed and informed consent was obtained from the patient.  Prep: The patient was prepped with betadine.  Antibiotic prophylaxis: Ciprofloxacin.  Procedure Note:  Urethral meatus:. No abnormalities.  Anterior urethra: No abnormalities.  Prostatic urethra: No abnormalities.  Bladder: Visulization was clear. The ureteral orifices were in the normal anatomic position bilaterally and had clear efflux of urine. A systematic survey of the bladder demonstrated no bladder tumors or stones. The mucosa was smooth without abnormalities. Examination of the bladder demonstrated erythematous mucosa. on right with --> A solitary tumor was visualized in the bladder. A papillary tumor was seen in the bladder. This tumor was located on the right  side of the bladder. Nodular appearance beside it. The patient tolerated the procedure well.  Complications: None.    Assessment Assessed  1. Chronic renal insufficiency (N18.9) 2. Malignant neoplasm of lateral wall of urinary bladder (C67.2) 3. Prostate cancer (C61)  Plan Health Maintenance  1. UA With REFLEX; [Do Not Release]; Status:Complete;   [id number] 01:38PM Malignant neoplasm of lateral wall of urinary bladder  2. Follow-up Schedule Surgery Office  Follow-up  Status: Hold For - Appointment   Requested for: [id number] 3. AU CT-HEMATURIA PROTOCOL; Status:Hold For - Appointment,PreCert,Date of  Service,Print; Requested for:07Mar2017;  Prostate cancer  4. BUN & CREATININE; Status:Hold For - Specimen/Data Collection,Appointment;  Requested for:07Mar2017;  5. PSA; Status:Hold For - Specimen/Data Collection,Appointment; Requested  for:07Mar2017;   Discussion/Summary            High-grade TA and CIS recurrence after BCG-early recurrence right and a nodular appearance beside. Check CT and set up for cysto, bbx, poss TURBT.     PCa - PSA was  sent (after cysto). Bun and Cr sent.     ED - consider IPP or injections.      Signatures Electronically signed by : Festus Aloe, M.D.; May 07 2015  2:37PM EST  Add: CT does show Focal thickening of the right bladder wall concerning for bladder cancer.  Will need TUR of this area.

## 2015-05-27 NOTE — Progress Notes (Signed)
bmet results faxed by epic to dr [email or web address]

## 2015-05-27 NOTE — Progress Notes (Signed)
CT ABDOMEN AND PELVIS 05-23-15 [facility] ON CHART

## 2015-05-28 ENCOUNTER — Encounter (HOSPITAL_COMMUNITY)
Admission: RE | Disposition: A | Discharge status: Home / Self Care | Payer: Self-pay | Source: Ambulatory Visit | Attending: Urology

## 2015-05-28 ENCOUNTER — Encounter (HOSPITAL_COMMUNITY): Payer: Self-pay | Admitting: *Deleted

## 2015-05-28 ENCOUNTER — Ambulatory Visit (HOSPITAL_COMMUNITY): Payer: Medicare Other | Admitting: Certified Registered"

## 2015-05-28 ENCOUNTER — Ambulatory Visit (HOSPITAL_COMMUNITY)
Admission: RE | Admit: 2015-05-28 | Discharge: 2015-05-28 | Disposition: A | Discharge status: Home / Self Care | Payer: Medicare Other | Source: Ambulatory Visit | Attending: Urology | Admitting: Urology

## 2015-05-28 DIAGNOSIS — N189 Chronic kidney disease, unspecified: Secondary | ICD-10-CM | POA: Insufficient documentation

## 2015-05-28 DIAGNOSIS — R3912 Poor urinary stream: Secondary | ICD-10-CM | POA: Insufficient documentation

## 2015-05-28 DIAGNOSIS — Z8546 Personal history of malignant neoplasm of prostate: Secondary | ICD-10-CM | POA: Insufficient documentation

## 2015-05-28 DIAGNOSIS — I2510 Atherosclerotic heart disease of native coronary artery without angina pectoris: Secondary | ICD-10-CM | POA: Insufficient documentation

## 2015-05-28 DIAGNOSIS — N529 Male erectile dysfunction, unspecified: Secondary | ICD-10-CM | POA: Diagnosis not present

## 2015-05-28 DIAGNOSIS — I4891 Unspecified atrial fibrillation: Secondary | ICD-10-CM | POA: Insufficient documentation

## 2015-05-28 DIAGNOSIS — E7800 Pure hypercholesterolemia, unspecified: Secondary | ICD-10-CM | POA: Insufficient documentation

## 2015-05-28 DIAGNOSIS — J449 Chronic obstructive pulmonary disease, unspecified: Secondary | ICD-10-CM | POA: Insufficient documentation

## 2015-05-28 DIAGNOSIS — Z79899 Other long term (current) drug therapy: Secondary | ICD-10-CM | POA: Insufficient documentation

## 2015-05-28 DIAGNOSIS — Z7982 Long term (current) use of aspirin: Secondary | ICD-10-CM | POA: Insufficient documentation

## 2015-05-28 DIAGNOSIS — K219 Gastro-esophageal reflux disease without esophagitis: Secondary | ICD-10-CM | POA: Insufficient documentation

## 2015-05-28 DIAGNOSIS — N401 Enlarged prostate with lower urinary tract symptoms: Secondary | ICD-10-CM | POA: Diagnosis not present

## 2015-05-28 DIAGNOSIS — Z87891 Personal history of nicotine dependence: Secondary | ICD-10-CM | POA: Insufficient documentation

## 2015-05-28 DIAGNOSIS — C672 Malignant neoplasm of lateral wall of bladder: Secondary | ICD-10-CM | POA: Insufficient documentation

## 2015-05-28 DIAGNOSIS — Z7951 Long term (current) use of inhaled steroids: Secondary | ICD-10-CM | POA: Insufficient documentation

## 2015-05-28 DIAGNOSIS — N138 Other obstructive and reflux uropathy: Secondary | ICD-10-CM | POA: Insufficient documentation

## 2015-05-28 DIAGNOSIS — I739 Peripheral vascular disease, unspecified: Secondary | ICD-10-CM | POA: Insufficient documentation

## 2015-05-28 HISTORY — PX: TRANSURETHRAL RESECTION OF BLADDER TUMOR: SHX2575

## 2015-05-28 SURGERY — TURBT (TRANSURETHRAL RESECTION OF BLADDER TUMOR)
Anesthesia: General

## 2015-05-28 MED ORDER — SODIUM CHLORIDE 0.9 % IR SOLN
Status: DC | PRN
  Administered 2015-05-28: 6000 mL via INTRAVESICAL

## 2015-05-28 MED ORDER — PROPOFOL 10 MG/ML IV BOLUS
INTRAVENOUS | Status: DC | PRN
  Administered 2015-05-28: 140 mg via INTRAVENOUS

## 2015-05-28 MED ORDER — LACTATED RINGERS IV SOLN
INTRAVENOUS | Status: DC
  Administered 2015-05-28: 1000 mL via INTRAVENOUS
  Administered 2015-05-28: 12:00:00 via INTRAVENOUS

## 2015-05-28 MED ORDER — NITROFURANTOIN MONOHYD MACRO 100 MG PO CAPS: 100.0000 mg | ORAL_CAPSULE | Freq: Every day | ORAL | Status: DC

## 2015-05-28 MED ORDER — ONDANSETRON HCL 4 MG/2ML IJ SOLN
INTRAMUSCULAR | Status: DC | PRN
  Administered 2015-05-28 (×2): 2 mg via INTRAVENOUS

## 2015-05-28 MED ORDER — CEFAZOLIN SODIUM-DEXTROSE 2-4 GM/100ML-% IV SOLN
2.0000 g | INTRAVENOUS | Status: AC
  Administered 2015-05-28: 2 g via INTRAVENOUS
  Filled 2015-05-28: qty 100

## 2015-05-28 MED ORDER — LIDOCAINE HCL (CARDIAC) 20 MG/ML IV SOLN
INTRAVENOUS | Status: AC
  Filled 2015-05-28: qty 5

## 2015-05-28 MED ORDER — CEFAZOLIN SODIUM-DEXTROSE 2-3 GM-% IV SOLR
INTRAVENOUS | Status: AC
  Filled 2015-05-28: qty 50

## 2015-05-28 MED ORDER — 0.9 % SODIUM CHLORIDE (POUR BTL) OPTIME
TOPICAL | Status: DC | PRN
  Administered 2015-05-28: 1000 mL

## 2015-05-28 MED ORDER — FENTANYL CITRATE (PF) 100 MCG/2ML IJ SOLN
25.0000 ug | INTRAMUSCULAR | Status: DC | PRN
  Administered 2015-05-28: 50 ug via INTRAVENOUS

## 2015-05-28 MED ORDER — PHENYLEPHRINE HCL 10 MG/ML IJ SOLN
INTRAMUSCULAR | Status: DC | PRN
  Administered 2015-05-28 (×5): 80 ug via INTRAVENOUS

## 2015-05-28 MED ORDER — DEXAMETHASONE SODIUM PHOSPHATE 10 MG/ML IJ SOLN
INTRAMUSCULAR | Status: DC | PRN
  Administered 2015-05-28: 5 mg via INTRAVENOUS

## 2015-05-28 MED ORDER — FENTANYL CITRATE (PF) 100 MCG/2ML IJ SOLN
INTRAMUSCULAR | Status: AC
  Filled 2015-05-28: qty 2

## 2015-05-28 MED ORDER — FENTANYL CITRATE (PF) 100 MCG/2ML IJ SOLN
INTRAMUSCULAR | Status: DC | PRN
  Administered 2015-05-28: 50 ug via INTRAVENOUS

## 2015-05-28 MED ORDER — PROPOFOL 10 MG/ML IV BOLUS
INTRAVENOUS | Status: AC
  Filled 2015-05-28: qty 20

## 2015-05-28 MED ORDER — LIDOCAINE HCL (CARDIAC) 20 MG/ML IV SOLN
INTRAVENOUS | Status: DC | PRN
  Administered 2015-05-28: 60 mg via INTRAVENOUS

## 2015-05-28 MED ORDER — DEXAMETHASONE SODIUM PHOSPHATE 10 MG/ML IJ SOLN
INTRAMUSCULAR | Status: AC
  Filled 2015-05-28: qty 1

## 2015-05-28 MED ORDER — TRAMADOL HCL 50 MG PO TABS: 50.0000 mg | ORAL_TABLET | Freq: Four times a day (QID) | ORAL | Status: DC | PRN

## 2015-05-28 MED ORDER — ONDANSETRON HCL 4 MG/2ML IJ SOLN
INTRAMUSCULAR | Status: AC
  Filled 2015-05-28: qty 4

## 2015-05-28 SURGICAL SUPPLY — 10 items
BAG URINE DRAINAGE (UROLOGICAL SUPPLIES) IMPLANT
BAG URO CATCHER STRL LF (MISCELLANEOUS) ×3 IMPLANT
CATH FOLEY 2WAY 5CC 20FR (CATHETERS) ×3 IMPLANT
GLOVE BIOGEL M STRL SZ7.5 (GLOVE) ×3 IMPLANT
GOWN STRL REUS W/TWL XL LVL3 (GOWN DISPOSABLE) ×3 IMPLANT
LOOP CUT BIPOLAR 24F LRG (ELECTROSURGICAL) ×2 IMPLANT
MANIFOLD NEPTUNE II (INSTRUMENTS) ×3 IMPLANT
PACK CYSTO (CUSTOM PROCEDURE TRAY) ×3 IMPLANT
TUBING CONNECTING 10 (TUBING) ×2 IMPLANT
TUBING CONNECTING 10' (TUBING) ×1

## 2015-05-28 NOTE — Op Note (Signed)
Preoperative diagnosis: Malignant neoplasm lateral bladder Postoperative diagnosis: Same  Procedure: TURBT 2-5 cm  Surgeon: Junious Silk  Anesthesia: Gen.  Indication for procedure: 82 year old with history of high-grade urothelial carcinoma and CIS with a more nodular and papillary type tumor recurrence  a right posterior bladder. This was visible on the CT scan as well. Patient brought for resection today.  Findings: On cystoscopy the tumor had a fine papillary component with a more nodular component surrounding it. It was deep into the bladder wall. The ureteral orifices were identified. The right ureteral orifice was inferior to this tumor and had evidence of prior resection. There was good efflux from both ureteral orifices throughout and at the end of the case. The remainder the bladder was unremarkable.  I performed what I consider a max TUR. Resection seem to be down into the muscle. There was a deeper area in the middle with a small amount of residual tumor which was fulgurated.  On exam under anesthesia the penis was circumcised and without mass or lesion. The testicles were descended bilaterally and palpably normal. On digital rectal exam there was some mild prostate enlargement but no specific hard area or nodule. There were no palpable bladder masses.  Description of procedure: After consent was obtained patient was brought to the operating room. After adequate anesthesia he was placed in lithotomy position and prepped and draped in the usual sterile fashion. A timeout was performed to confirm the patient and procedure. The cystoscope was passed per urethra and the bladder inspected with 30 and 70 lens.   I then performed an exam under anesthesia. After a glove change, the resectoscope sheath was then passed with the visual obturator and then the loop and handle were placed. The nodular and papillary components were resected and clinically it did appear quite deep but I was pleased with  the resection. There may have been a few residual cells in the very midline. But this area was the deepest and seem to be getting thin, therefore it was fulgurated. Hemostasis was excellent at low-pressure. There was good efflux of urine from both ureteral orifices. The bladder was refilled and the scope removed. An 84 French coud catheter was advanced and left gravity drainage. Urine was clear and the patient was awakened taken to recovery room in stable condition.  Complications: None  Blood loss: 3 mL  Specimens: Bladder tumor, right, to pathology  Drains: 18 French coud catheter

## 2015-05-28 NOTE — Anesthesia Postprocedure Evaluation (Signed)
Anesthesia Post Note  Patient: Fred Newton  Procedure(s) Performed: Procedure(s) (LRB): TRANSURETHRAL RESECTION OF BLADDER TUMOR (TURBT) (N/A)  Patient location during evaluation: PACU Anesthesia Type: General Level of consciousness: awake and alert Pain management: pain level controlled Vital Signs Assessment: post-procedure vital signs reviewed and stable Respiratory status: spontaneous breathing, nonlabored ventilation, respiratory function stable and patient connected to nasal cannula oxygen Cardiovascular status: blood pressure returned to baseline and stable Postop Assessment: no signs of nausea or vomiting Anesthetic complications: no    Last Vitals:  Filed Vitals:   05/28/15 1240 05/28/15 1400  BP: 123/66 122/59  Pulse:    Temp: 36.3 C 36.6 C  Resp: 16 20    Last Pain:  Filed Vitals:   05/28/15 1425  PainSc: 2                  Kaliopi Blyden J

## 2015-05-28 NOTE — Transfer of Care (Signed)
Immediate Anesthesia Transfer of Care Note  Patient: Fred Newton  Procedure(s) Performed: Procedure(s): TRANSURETHRAL RESECTION OF BLADDER TUMOR (TURBT) (N/A)  Patient Location: PACU  Anesthesia Type:General  Level of Consciousness:  sedated, patient cooperative and responds to stimulation  Airway & Oxygen Therapy:Patient Spontanous Breathing and Patient connected to face mask oxgen  Post-op Assessment:  Report given to PACU RN and Post -op Vital signs reviewed and stable  Post vital signs:  Reviewed and stable  Last Vitals:  Filed Vitals:   05/28/15 0855  BP: 110/55  Pulse: 84  Temp: 36.4 C  Resp: 18    Complications: No apparent anesthesia complications

## 2015-05-28 NOTE — Interval H&P Note (Signed)
History and Physical Interval Note:  05/28/2015 11:00 AM  Fred Newton  has presented today for surgery, with the diagnosis of BLADDER CANCER  The various methods of treatment have been discussed with the patient and family. After consideration of risks, benefits and other options for treatment, the patient has consented to  Procedure(s) with comments: CYSTOSCOPY WITH BIOPSY (N/A) - POSSIBLE BLADDER BIOPSY   TRANSURETHRAL RESECTION OF BLADDER TUMOR (TURBT) (N/A) as a surgical intervention .  The patient's history has been reviewed, patient examined, no change in status, stable for surgery.  I have reviewed the patient's chart and labs.  Questions were answered to the patient's satisfaction.  Discussed risk of bleeding, bladder perf among others. Possible need for catheter. Pt without dysuria, fever or hematuria. Discussed CT results.    Fred Newton

## 2015-05-28 NOTE — Discharge Instructions (Signed)
Foley Catheter Care, Adult A Foley catheter is a soft, flexible tube. This tube is placed into your bladder to drain pee (urine). If you go home with this catheter in place, follow the instructions below. TAKING CARE OF THE CATHETER  Wash your hands with soap and water.  Put soap and water on a clean washcloth.  Clean the skin where the tube goes into your body.  Clean away from the tube site.  Never wipe toward the tube.  Clean the area using a circular motion.  Remove all the soap. Pat the area dry with a clean towel. For males, reposition the skin that covers the end of the penis (foreskin).  Attach the tube to your leg with tape or a leg strap. Do not stretch the tube tight. If you are using tape, remove any stickiness left behind by past tape you used.  Keep the drainage bag below your hips. Keep it off the floor.  Check your tube during the day. Make sure it is working and draining. Make sure the tube does not curl, twist, or bend.  Do not pull on the tube or try to take it out. TAKING CARE OF THE DRAINAGE BAGS You will have a large overnight drainage bag and a small leg bag. You may wear the overnight bag any time. Never wear the small bag at night. Follow the directions below. Emptying the Drainage Bag Empty your drainage bag when it is  - full or at least 2-3 times a day.  Wash your hands with soap and water.  Keep the drainage bag below your hips.  Hold the dirty bag over the toilet or clean container.  Open the pour spout at the bottom of the bag. Empty the pee into the toilet or container. Do not let the pour spout touch anything.  Clean the pour spout with a gauze pad or cotton ball that has rubbing alcohol on it.  Close the pour spout.  Attach the bag to your leg with tape or a leg strap.  Wash your hands well. Changing the Drainage Bag Change your bag once a month or sooner if it starts to smell or look dirty.   Wash your hands with soap and  water.  Pinch the rubber tube so that pee does not spill out.  Disconnect the catheter tube from the drainage tube at the connection valve. Do not let the tubes touch anything.  Clean the end of the catheter tube with an alcohol wipe. Clean the end of a the drainage tube with a different alcohol wipe.  Connect the catheter tube to the drainage tube of the clean drainage bag.  Attach the new bag to the leg with tape or a leg strap. Avoid attaching the new bag too tightly.  Wash your hands well. Cleaning the Drainage Bag  Wash your hands with soap and water.  Wash the bag in warm, soapy water.  Rinse the bag with warm water.  Fill the bag with a mixture of white vinegar and water (1 cup vinegar to 1 quart warm water [.2 liter vinegar to 1 liter warm water]). Close the bag and soak it for 30 minutes in the solution.  Rinse the bag with warm water.  Hang the bag to dry with the pour spout open and hanging downward.  Store the clean bag (once it is dry) in a clean plastic bag.  Wash your hands well. PREVENT INFECTION  Wash your hands before and after touching your tube.  Take showers every day. Wash the skin where the tube enters your body. Do not take baths. Replace wet leg straps with dry ones, if this applies.  Do not use powders, sprays, or lotions on the genital area. Only use creams, lotions, or ointments as told by your doctor.  For females, wipe from front to back after going to the bathroom.  Drink enough fluids to keep your pee clear or pale yellow unless you are told not to have too much fluid (fluid restriction).  Do not let the drainage bag or tubing touch or lie on the floor.  Wear cotton underwear to keep the area dry. GET HELP IF:  Your pee is cloudy or smells unusually bad.  Your tube becomes clogged.  You are not draining pee into the bag or your bladder feels full.  Your tube starts to leak. GET HELP RIGHT AWAY IF:  You have pain, puffiness  (swelling), redness, or yellowish-white fluid (pus) where the tube enters the body.  You have pain in the belly (abdomen), legs, lower back, or bladder.  You have a fever.  You see blood fill the tube, or your pee is pink or red.  You feel sick to your stomach (nauseous), throw up (vomit), or have chills.  Your tube gets pulled out. MAKE SURE YOU:   Understand these instructions.  Will watch your condition.  Will get help right away if you are not doing well or get worse.   This information is not intended to replace advice given to you by your health care provider. Make sure you discuss any questions you have with your health care provider.   Document Released: 06/13/2012 Document Revised: 03/09/2014 Document Reviewed: 06/13/2012 Elsevier Interactive Patient Education 2016 [place] Anesthesia, Adult, Care After Refer to this sheet in the next few weeks. These instructions provide you with information on caring for yourself after your procedure. Your health care provider may also give you more specific instructions. Your treatment has been planned according to current medical practices, but problems sometimes occur. Call your health care provider if you have any problems or questions after your procedure. WHAT TO EXPECT AFTER THE PROCEDURE After the procedure, it is typical to experience:  Sleepiness.  Nausea and vomiting. HOME CARE INSTRUCTIONS  For the first 24 hours after general anesthesia:  Have a responsible person with you.  Do not drive a car. If you are alone, do not take public transportation.  Do not drink alcohol.  Do not take medicine that has not been prescribed by your health care provider.  Do not sign important papers or make important decisions.  You may resume a normal diet and activities as directed by your health care provider.  Change bandages (dressings) as directed.  If you have questions or problems that seem related to general  anesthesia, call the hospital and ask for the anesthetist or anesthesiologist on call. SEEK MEDICAL CARE IF:  You have nausea and vomiting that continue the day after anesthesia.  You develop a rash. SEEK IMMEDIATE MEDICAL CARE IF:   You have difficulty breathing.  You have chest pain.  You have any allergic problems.   This information is not intended to replace advice given to you by your health care provider. Make sure you discuss any questions you have with your health care provider.   Document Released: 05/25/2000 Document Revised: 03/09/2014 Document Reviewed: 06/17/2011 Elsevier Interactive Patient Education [employer].

## 2015-05-28 NOTE — Anesthesia Procedure Notes (Signed)
Procedure Name: LMA Insertion Date/Time: 05/28/2015 11:40 AM Performed by: Freddie Breech Pre-anesthesia Checklist: Patient identified, Emergency Drugs available, Suction available, Patient being monitored and Timeout performed Patient Re-evaluated:Patient Re-evaluated prior to inductionOxygen Delivery Method: Circle system utilized Preoxygenation: Pre-oxygenation with 100% oxygen Intubation Type: IV induction LMA: LMA inserted LMA Size: 4.0 Number of attempts: 1 Placement Confirmation: positive ETCO2,  CO2 detector and breath sounds checked- equal and bilateral Tube secured with: Tape Dental Injury: Teeth and Oropharynx as per pre-operative assessment

## 2015-05-28 NOTE — Interval H&P Note (Signed)
History and Physical Interval Note:  05/28/2015 10:36 AM  Fred Newton  has presented today for surgery, with the diagnosis of BLADDER CANCER  The various methods of treatment have been discussed with the patient and family. After consideration of risks, benefits and other options for treatment, the patient has consented to  Procedure(s) with comments: CYSTOSCOPY WITH BIOPSY (N/A) - POSSIBLE BLADDER BIOPSY   TRANSURETHRAL RESECTION OF BLADDER TUMOR (TURBT) (N/A) as a surgical intervention .  The patient's history has been reviewed, patient examined, no change in status, stable for surgery.  I have reviewed the patient's chart and labs.  Questions were answered to the patient's satisfaction.     [name]

## 2015-05-28 NOTE — Anesthesia Preprocedure Evaluation (Addendum)
Anesthesia Evaluation  Patient identified by MRN, date of birth, ID band Patient awake    Reviewed: Allergy & Precautions, NPO status , Patient's Chart, lab work & pertinent test results  Airway Mallampati: II  TM Distance: >3 FB Neck ROM: Full    Dental no notable dental hx.    Pulmonary shortness of breath, COPD, former smoker,    Pulmonary exam normal breath sounds clear to auscultation       Cardiovascular Exercise Tolerance: Good + CAD and + Peripheral Vascular Disease  Normal cardiovascular exam+ dysrhythmias  Rhythm:Regular Rate:Normal     Neuro/Psych PSYCHIATRIC DISORDERS negative neurological ROS     GI/Hepatic Neg liver ROS, GERD  ,  Endo/Other  negative endocrine ROS  Renal/GU Renal InsufficiencyRenal disease  negative genitourinary   Musculoskeletal  (+) Arthritis ,   Abdominal   Peds negative pediatric ROS (+)  Hematology negative hematology ROS (+)   Anesthesia Other Findings   Reproductive/Obstetrics negative OB ROS                           Anesthesia Physical Anesthesia Plan  ASA: III  Anesthesia Plan: General   Post-op Pain Management:    Induction: Intravenous  Airway Management Planned: LMA  Additional Equipment:   Intra-op Plan:   Post-operative Plan: Extubation in OR  Informed Consent: I have reviewed the patients History and Physical, chart, labs and discussed the procedure including the risks, benefits and alternatives for the proposed anesthesia with the patient or authorized representative who has indicated his/her understanding and acceptance.   Dental advisory given  Plan Discussed with: CRNA  Anesthesia Plan Comments:         Anesthesia Quick Evaluation

## 2015-06-21 ENCOUNTER — Encounter: Payer: Self-pay | Admitting: Oncology

## 2015-06-25 ENCOUNTER — Encounter: Payer: Self-pay | Admitting: Radiation Oncology

## 2015-07-02 NOTE — Progress Notes (Signed)
GU Location of Tumor / Histology: Urothelial Carcinoma DX 05/28/15  If Prostate Cancer, Gleason Score is (3+3=6 right base and apex, 3+4=7 right mid ) and PSA is (1.42)  Fred Newton presented  months ago with signs/symptoms of:     Past/Anticipated interventions by urology, if any: Dr. Marily Lente last sen 06/18/15.  Transurethral resection of bladder tumor March 2017  Past/Anticipated interventions by medical oncology, if any: no  Weight changes, if any: no  Bowel/Bladder complaints, if any: No urinary complaints, occasional constipation  Nausea/Vomiting, if any: no  Pain issues, if any:  no  SAFETY ISSUES:  Prior radiation? no  Pacemaker/ICD? no  Is the patient on methotrexate? no  Current Complaints / other details:  82 year old male. Had a stress test in the last week, waiting result, COPD, GERD Mother Colon cancer

## 2015-07-04 ENCOUNTER — Ambulatory Visit: Payer: Medicare Other | Admitting: Radiation Oncology

## 2015-07-04 ENCOUNTER — Ambulatory Visit: Payer: Medicare Other

## 2015-07-04 ENCOUNTER — Inpatient Hospital Stay: Admission: RE | Admit: 2015-07-04 | Payer: Medicare Other | Source: Ambulatory Visit

## 2015-07-04 ENCOUNTER — Ambulatory Visit: Admission: RE | Admit: 2015-07-04 | Payer: Medicare Other | Source: Ambulatory Visit | Admitting: Radiation Oncology

## 2015-07-05 ENCOUNTER — Ambulatory Visit
Admission: RE | Admit: 2015-07-05 | Discharge: 2015-07-05 | Disposition: A | Discharge status: Home / Self Care | Payer: Medicare Other | Source: Ambulatory Visit | Attending: Radiation Oncology | Admitting: Radiation Oncology

## 2015-07-05 ENCOUNTER — Telehealth: Payer: Self-pay | Admitting: Oncology

## 2015-07-05 ENCOUNTER — Encounter: Payer: Self-pay | Admitting: Radiation Oncology

## 2015-07-05 ENCOUNTER — Ambulatory Visit (HOSPITAL_BASED_OUTPATIENT_CLINIC_OR_DEPARTMENT_OTHER): Payer: Medicare Other | Admitting: Oncology

## 2015-07-05 VITALS — BP 100/81 | HR 103 | Temp 98.1°F | Resp 18 | Ht 72.0 in | Wt 174.8 lb

## 2015-07-05 VITALS — BP 99/56 | HR 86 | Resp 12 | Wt 173.8 lb

## 2015-07-05 DIAGNOSIS — K219 Gastro-esophageal reflux disease without esophagitis: Secondary | ICD-10-CM | POA: Insufficient documentation

## 2015-07-05 DIAGNOSIS — I499 Cardiac arrhythmia, unspecified: Secondary | ICD-10-CM | POA: Insufficient documentation

## 2015-07-05 DIAGNOSIS — Z807 Family history of other malignant neoplasms of lymphoid, hematopoietic and related tissues: Secondary | ICD-10-CM

## 2015-07-05 DIAGNOSIS — J449 Chronic obstructive pulmonary disease, unspecified: Secondary | ICD-10-CM | POA: Diagnosis not present

## 2015-07-05 DIAGNOSIS — C679 Malignant neoplasm of bladder, unspecified: Secondary | ICD-10-CM

## 2015-07-05 DIAGNOSIS — I2510 Atherosclerotic heart disease of native coronary artery without angina pectoris: Secondary | ICD-10-CM | POA: Diagnosis not present

## 2015-07-05 DIAGNOSIS — Z87891 Personal history of nicotine dependence: Secondary | ICD-10-CM

## 2015-07-05 DIAGNOSIS — J439 Emphysema, unspecified: Secondary | ICD-10-CM | POA: Insufficient documentation

## 2015-07-05 DIAGNOSIS — N289 Disorder of kidney and ureter, unspecified: Secondary | ICD-10-CM

## 2015-07-05 DIAGNOSIS — I714 Abdominal aortic aneurysm, without rupture: Secondary | ICD-10-CM | POA: Insufficient documentation

## 2015-07-05 DIAGNOSIS — Z510 Encounter for antineoplastic radiation therapy: Secondary | ICD-10-CM | POA: Insufficient documentation

## 2015-07-05 DIAGNOSIS — F0390 Unspecified dementia without behavioral disturbance: Secondary | ICD-10-CM | POA: Diagnosis not present

## 2015-07-05 DIAGNOSIS — C672 Malignant neoplasm of lateral wall of bladder: Secondary | ICD-10-CM

## 2015-07-05 DIAGNOSIS — Z9981 Dependence on supplemental oxygen: Secondary | ICD-10-CM | POA: Diagnosis not present

## 2015-07-05 NOTE — Telephone Encounter (Signed)
tx and labs added pt will p/u rest of sched when he comes in again

## 2015-07-05 NOTE — Progress Notes (Signed)
Radiation Oncology         [phone] ________________________________  Initial outpatient Consultation  Name: Fred Newton MRN: [id number]  Date: 07/05/2015  DOB: [birth year]  DG:UYQI, Dennison Mascot, MD  Festus Aloe, MD   REFERRING PHYSICIAN: Festus Aloe, MD  DIAGNOSIS: 82 y.o. gentleman with muscle invasive bladder cancer.     ICD-9-CM ICD-10-CM   1. Malignant neoplasm of urinary bladder, unspecified site (HCC) 188.9 C67.9     HISTORY OF PRESENT ILLNESS::Fred Newton is a 82 y.o. gentleman.  He has had a history of superficial bladder ca treated with resection and BCG. He has previously been treated at [facility] for this. He had cystoscopy with Dr. Junious Silk on 05/28/2015 and found to have muscle invasive disease. The procedure was considered to be maximal TURBT.  The patient has a history of prostate cancer treated with cryotherapy by Dr. Terance Hart.  The patient reviewed the biopsy results with his urologist and he has kindly been referred today for discussion of potential radiation treatment options.      PREVIOUS RADIATION THERAPY: No  PAST MEDICAL HISTORY:  has a past medical history of COPD (chronic obstructive pulmonary disease) ([place]); GERD (gastroesophageal reflux disease); Cancer [facility]) (2011); Emphysema of lung ([place]); Dysrhythmia; Pneumothorax; DJD (degenerative joint disease); History of home oxygen therapy; Cough; Coronary artery disease; Shortness of breath; Hearing loss; Dementia; Aortic aneurysm ([place]); and Abdominal aortic aneurysm ([place]).    PAST SURGICAL HISTORY: Past Surgical History  Procedure Laterality Date  . Right lung surgery  1984  . Prostatectomy  2007  . Removal of sinus polyps   1969  . Rectal prolapse, hemorrhoidectomy   2015  . Cataract surgery   08/2011  . Cataract surgery   2013    right eye  . Transurethral resection of bladder tumor N/A 10/27/2013    Procedure: TRANSURETHRAL RESECTION OF BLADDER TUMOR (TURBT) WITH FULGURATION MITOMYCIN  C/POSSIBLE JJ STENT  ;  Surgeon: Festus Aloe, MD;  Location: [facility];  Service: Urology;  Laterality: N/A;  . Left wrist surgery  1938  . Sinus polyp surgery Bilateral 1969    [facility]  . Left leg surgery  1951  . Cystoscopy with biopsy N/A 10/05/2014    Procedure: CYSTOSCOPY WITH BIOPSY;  Surgeon: Festus Aloe, MD;  [facility]: [facility];  Service: Urology;  Laterality: N/A;  . Cystoscopy w/ retrogrades Bilateral 10/05/2014    Procedure: CYSTOSCOPY WITH BILATERAL RETROGRADE PYELOGRAM;  Surgeon: Festus Aloe, MD;  [facility];  Service: Urology;  Laterality: Bilateral;  C-ARM      . Transurethral resection of bladder tumor N/A 05/28/2015    Procedure: TRANSURETHRAL RESECTION OF BLADDER TUMOR (TURBT);  Surgeon: Festus Aloe, MD;  [facility];  Service: Urology;  Laterality: N/A;    FAMILY HISTORY: family history includes Colon cancer in his mother; Heart Problems in his brother; Heart attack in his sister; Hyperlipidemia in his sister; Mental illness in his sister; Multiple myeloma in his sister.  SOCIAL HISTORY:  reports that he quit smoking about 9 years ago. He has never used smokeless tobacco. He reports that he does not drink alcohol or use illicit drugs.  ALLERGIES: Hibiclens  MEDICATIONS:  Current Outpatient Prescriptions  Medication Sig Dispense Refill  . albuterol (PROVENTIL HFA;VENTOLIN HFA) 108 (90 BASE) MCG/ACT inhaler Inhale 2 puffs into the lungs every 6 (six) hours as needed for wheezing or shortness of breath.    Marland Kitchen aspirin EC 81 MG tablet Take 81 mg by mouth every morning.     Marland Kitchen  Calcium Carbonate-Vitamin D (CALCIUM 600+D) 600-400 MG-UNIT tablet Take 1 tablet by mouth daily.    . camphor-menthol (SARNA) lotion Apply 1 application topically 2 (two) times daily as needed for itching.    . cetirizine (ZYRTEC) 10 MG tablet Take 10 mg by mouth every morning.     . cycloSPORINE (RESTASIS) 0.05 % ophthalmic emulsion Place 1 drop into both eyes 2 (two) times  daily as needed (Dry eyes).     Marland Kitchen esomeprazole (NEXIUM) 40 MG capsule Take 40 mg by mouth every evening.     . fluticasone (FLONASE) 50 MCG/ACT nasal spray Place 1 spray into both nostrils daily as needed for allergies or rhinitis.    . Fluticasone-Salmeterol (ADVAIR) 250-50 MCG/DOSE AEPB Inhale 1 puff into the lungs 2 (two) times daily.    Marland Kitchen ketoconazole (NIZORAL) 2 % cream Apply 1 application topically 2 (two) times daily.     . Multiple Vitamin (MULTIVITAMIN WITH MINERALS) TABS tablet Take 1 tablet by mouth every morning.     . Omega-3 Fatty Acids (FISH OIL) 1000 MG CAPS Take 1,000 mg by mouth 2 (two) times daily.    . OXYGEN Inhale into the lungs. 2 LITERS AT NIGHT    . Psyllium (METAMUCIL) WAFR Take 2 Wafers by mouth daily.     . simvastatin (ZOCOR) 40 MG tablet Take 40 mg by mouth every evening.     . tamsulosin (FLOMAX) 0.4 MG CAPS capsule Take 0.4 mg by mouth every evening.     . tiotropium (SPIRIVA) 18 MCG inhalation capsule Place 18 mcg into inhaler and inhale daily at 12 noon.     . traMADol (ULTRAM) 50 MG tablet Take 1 tablet (50 mg total) by mouth every 6 (six) hours as needed for moderate pain. 20 tablet 0  . triamcinolone cream (KENALOG) 0.1 % Apply 1 application topically daily as needed (Ithcing).      No current facility-administered medications for this encounter.   Facility-Administered Medications Ordered in Other Encounters  Medication Dose Route Frequency Provider Last Rate Last Dose  . epirubicin (ELLENCE) 50 mg in sodium chloride 0.9 % bladder instillation  50 mg Bladder Instillation Once Festus Aloe, MD        REVIEW OF SYSTEMS:  A 15 point review of systems is documented in the electronic medical record. This was obtained by the nursing staff. However, I reviewed this with the patient to discuss relevant findings and make appropriate changes.  Pertinent items are noted in HPI. Pertinent items noted in HPI and remainder of comprehensive ROS otherwise negative..      82 year-old male. Unaccompanied today. Mother had colon cancer. Previous cryotherapy for prostate cancer with Dr. Terance Hart. No urinary complaints. Occasional constipation. Reports fatigue. Denies nausea, vomiting, weight changes, or pain. Had a stress test in the last week, awaiting result. COPD, GERD. He will see Dr. Clydene Laming later today. He would prefer appointments not to be scheduled early in the morning.   PHYSICAL EXAM: This patient is in no acute distress.  He is alert and oriented.   weight is 173 lb 12.8 oz (78.835 kg). His blood pressure is 99/56 and his pulse is 86. His respiration is 12 and oxygen saturation is 98%.  He exhibits no respiratory distress or labored breathing.  He appears neurologically intact.  His mood is pleasant.  His affect is appropriate.  Please note the digital rectal exam findings described above.  KPS = 90  100 - Normal; no complaints; no evidence of disease. 90   -  Able to carry on normal activity; minor signs or symptoms of disease. 80   - Normal activity with effort; some signs or symptoms of disease. 80   - Cares for self; unable to carry on normal activity or to do active work. 60   - Requires occasional assistance, but is able to care for most of his personal needs. 50   - Requires considerable assistance and frequent medical care. 70   - Disabled; requires special care and assistance. 46   - Severely disabled; hospital admission is indicated although death not imminent. 63   - Very sick; hospital admission necessary; active supportive treatment necessary. 10   - Moribund; fatal processes progressing rapidly. 0     - Dead  Karnofsky DA, Abelmann Kersey, Craver LS and Burchenal [facility] [phone]) The use of the nitrogen mustards in the palliative treatment of carcinoma: with particular reference to bronchogenic carcinoma Cancer 1 [phone]   LABORATORY DATA:  Lab Results  Component Value Date   WBC 9.8 05/27/2015   HGB 11.5* 05/27/2015   HCT 33.3* 05/27/2015    MCV 93.3 05/27/2015   PLT 239 05/27/2015   Lab Results  Component Value Date   NA 139 05/27/2015   K 5.2* 05/27/2015   CL 106 05/27/2015   CO2 25 05/27/2015   No results found for: ALT, AST, GGT, ALKPHOS, BILITOT   RADIOGRAPHY: No results found.    IMPRESSION: This gentleman is a very nice 82 year-old with muscle invasive bladder cancer. He is not an ideal surgical canditate. The patient may be amenable to bladder preserving chemo-radiation.  PLAN: Today, I talked to the patient and family about the findings and work-up thus far.  We discussed the natural history of muscle invasive bladder cancer and general treatment, highlighting the role of radiotherapy in the management.  We discussed the available radiation techniques, and focused on the details of logistics and delivery.  We reviewed the anticipated acute and late sequelae associated with radiation in this setting.  The patient was encouraged to ask questions that I answered to the best of my ability.  I filled out a patient counseling form during our discussion including treatment diagrams.  We retained a copy for our records.  The patient would like to proceed with radiation and will undergo CT simulation later today.  I spent 60 minutes minutes face to face with the patient and more than 50% of that time was spent in counseling and/or coordination of care.   ------------------------------------------------  Sheral Apley. Tammi Klippel, M.D.    This document serves as a record of services personally performed by Tyler Pita, MD. It was created on his behalf by Arlyce Harman, a trained medical scribe. The creation of this record is based on the scribe's personal observations and the provider's statements to them. This document has been checked and approved by the attending provider.

## 2015-07-05 NOTE — Telephone Encounter (Signed)
Gave pt appt & avs °

## 2015-07-05 NOTE — Consult Note (Signed)
Reason for Referral:   HPI: 82 year old gentleman with past medical history significant for COPD, degenerative arthritis as well as superficial bladder tumor that has been noninvasive in the past. He had followed with Dr. Terance Hart for many years and have treated him in the past with BCG. Most recently, he underwent a cystoscopy under the care of Dr. Junious Silk and was found to have more nodular and papillary type tumor in the right posterior bladder that was noted on a CT scan. On 05/28/2015 he underwent TURBT and the pathology showed high-grade invasive urothelial carcinoma with invasion into the muscularis propria. CT scan of the abdomen and pelvis on 05/27/2015 showed a dominant enhancing lesion in the right posterior lateral aspect of the bladder wall but no evidence of any adenopathy. Given his multiple comorbid conditions as well as his preference cystectomy is really not an option. He was evaluated by Dr. Tammi Klippel for possible bladder preserving approach utilizing radiation therapy with chemotherapy. He underwent simulation today and tolerated very well although he did develop brief hematuria that have subsided at this time. Clinically, he reports no other symptoms. He continues to live in [place], [place] and commutes close to 7 miles for his appointments. He does have family that lives in town and planning to stay here during his radiation therapy. He does not report any abdominal pain or discomfort. An no longer reporting hematuria at this time. His appetite is excellent and continues to be in reasonable performance status.  He is not report any headaches, blurry vision, syncope or seizures. He does not report any fevers, chills or sweats. He does not report any cough, wheezing or hemoptysis. Does not report any nausea, vomiting, abdominal pain, hematochezia or melena. He does not report any frequency, urgency or hesitancy. Does not report any skeletal complaints. Remaining review of systems  unremarkable.   Past Medical History  Diagnosis Date  . COPD (chronic obstructive pulmonary disease) ([place])   . GERD (gastroesophageal reflux disease)   . Cancer [facility]) 2011    Prostate  . Emphysema of lung ([place])   . Dysrhythmia     hx of heart racing - 2006   . Pneumothorax     hx of 1984 right lung   . DJD (degenerative joint disease)     neck  . History of home oxygen therapy     oxygen concentrator 2 liters at hs and prn  . Cough     with green phlegm last week or so, chest xray 10-03-14 dr vyas on chart  . Coronary artery disease   . Shortness of breath     WITH EXERTION  . Hearing loss     MINOR  . Dementia   . Aortic aneurysm ([place])     ABDOMEN CT OELIS 05-23-15 [facility] SHOWS ON TRUE AAA  . Abdominal aortic aneurysm [facility])   :  Past Surgical History  Procedure Laterality Date  . Right lung surgery  1984  . Prostatectomy  2007  . Removal of sinus polyps   1969  . Rectal prolapse, hemorrhoidectomy   2015  . Cataract surgery   08/2011  . Cataract surgery   2013    right eye  . Transurethral resection of bladder tumor N/A 10/27/2013    Procedure: TRANSURETHRAL RESECTION OF BLADDER TUMOR (TURBT) WITH FULGURATION MITOMYCIN C/POSSIBLE JJ STENT  ;  Surgeon: Festus Aloe, MD;  [facility];  Service: Urology;  Laterality: N/A;  . Left wrist surgery  1938  . Sinus polyp  surgery Bilateral 1969    [facility]  . Left leg surgery  1951  . Cystoscopy with biopsy N/A 10/05/2014    Procedure: CYSTOSCOPY WITH BIOPSY;  Surgeon: Festus Aloe, MD;  [facility];  Service: Urology;  Laterality: N/A;  . Cystoscopy w/ retrogrades Bilateral 10/05/2014    Procedure: CYSTOSCOPY WITH BILATERAL RETROGRADE PYELOGRAM;  Surgeon: Festus Aloe, MD;  [facility];  Service: Urology;  Laterality: Bilateral;  C-ARM      . Transurethral resection of bladder tumor N/A 05/28/2015    Procedure: TRANSURETHRAL RESECTION OF BLADDER TUMOR (TURBT);  Surgeon: Festus Aloe, MD;   [facility];  Service: Urology;  Laterality: N/A;  :   Current outpatient prescriptions:  .  albuterol (PROVENTIL HFA;VENTOLIN HFA) 108 (90 BASE) MCG/ACT inhaler, Inhale 2 puffs into the lungs every 6 (six) hours as needed for wheezing or shortness of breath., Disp: , Rfl:  .  aspirin EC 81 MG tablet, Take 81 mg by mouth every morning. , Disp: , Rfl:  .  Calcium Carbonate-Vitamin D (CALCIUM 600+D) 600-400 MG-UNIT tablet, Take 1 tablet by mouth daily., Disp: , Rfl:  .  camphor-menthol (SARNA) lotion, Apply 1 application topically 2 (two) times daily as needed for itching., Disp: , Rfl:  .  cetirizine (ZYRTEC) 10 MG tablet, Take 10 mg by mouth every morning. , Disp: , Rfl:  .  cycloSPORINE (RESTASIS) 0.05 % ophthalmic emulsion, Place 1 drop into both eyes 2 (two) times daily as needed (Dry eyes). , Disp: , Rfl:  .  esomeprazole (NEXIUM) 40 MG capsule, Take 40 mg by mouth every evening. , Disp: , Rfl:  .  fluticasone (FLONASE) 50 MCG/ACT nasal spray, Place 1 spray into both nostrils daily as needed for allergies or rhinitis., Disp: , Rfl:  .  Fluticasone-Salmeterol (ADVAIR) 250-50 MCG/DOSE AEPB, Inhale 1 puff into the lungs 2 (two) times daily., Disp: , Rfl:  .  ketoconazole (NIZORAL) 2 % cream, Apply 1 application topically 2 (two) times daily. , Disp: , Rfl:  .  Multiple Vitamin (MULTIVITAMIN WITH MINERALS) TABS tablet, Take 1 tablet by mouth every morning. , Disp: , Rfl:  .  Omega-3 Fatty Acids (FISH OIL) 1000 MG CAPS, Take 1,000 mg by mouth 2 (two) times daily., Disp: , Rfl:  .  OXYGEN, Inhale into the lungs. 2 LITERS AT NIGHT, Disp: , Rfl:  .  Psyllium (METAMUCIL) WAFR, Take 2 Wafers by mouth daily. , Disp: , Rfl:  .  simvastatin (ZOCOR) 40 MG tablet, Take 40 mg by mouth every evening. , Disp: , Rfl:  .  tamsulosin (FLOMAX) 0.4 MG CAPS capsule, Take 0.4 mg by mouth every evening. , Disp: , Rfl:  .  tiotropium (SPIRIVA) 18 MCG inhalation capsule, Place 18 mcg into inhaler and inhale daily  at 12 noon. , Disp: , Rfl:  .  traMADol (ULTRAM) 50 MG tablet, Take 1 tablet (50 mg total) by mouth every 6 (six) hours as needed for moderate pain., Disp: 20 tablet, Rfl: 0 .  triamcinolone cream (KENALOG) 0.1 %, Apply 1 application topically daily as needed (Ithcing). , Disp: , Rfl:  No current facility-administered medications for this visit.  Facility-Administered Medications Ordered in Other Visits:  .  epirubicin (ELLENCE) 50 mg in sodium chloride 0.9 % bladder instillation, 50 mg, Bladder Instillation, Once, Festus Aloe, MD:  Allergies  Allergen Reactions  . Hibiclens [Chlorhexidine] Itching, Rash and Other (See Comments)    Patient stated,"it burns my skin."  :  Family History  Problem Relation Age of Onset  . Colon cancer Mother   . Heart Problems Brother   . Multiple myeloma Sister   . Hyperlipidemia Sister   . Mental illness Sister   . Heart attack Sister   :  Social History   Social History  . Marital Status: Married    Spouse Name: N/A  . Number of Children: N/A  . Years of Education: N/A   Occupational History  . Not on file.   Social History Main Topics  . Smoking status: Former Smoker -- 2.00 packs/day for 50 years    Quit date: 01/30/2006  . Smokeless tobacco: Never Used  . Alcohol Use: No  . Drug Use: No  . Sexual Activity: Not on file   Other Topics Concern  . Not on file   Social History Narrative  :  Pertinent items are noted in HPI.  Exam: Blood pressure 100/81, pulse 103, temperature 98.1 F (36.7 C), temperature source Oral, resp. rate 18, height 6' (1.829 m), weight 174 lb 12.8 oz (79.289 kg), SpO2 94 %.  ECOG 0 General appearance: alert and cooperative Head: Normocephalic, without obvious abnormality Throat: lips, mucosa, and tongue normal; teeth and gums normal Neck: no adenopathy Back: negative Resp: clear to auscultation bilaterally Chest wall: no tenderness Cardio: regular rate and rhythm, S1, S2 normal, no murmur,  click, rub or gallop GI: soft, non-tender; bowel sounds normal; no masses,  no organomegaly GU examination: Revealed no lymphadenopathy, penile masses or scrotal masses. Clotted blood noted at the tip of his penis. Extremities: extremities normal, atraumatic, no cyanosis or edema Pulses: 2+ and symmetric Skin: Skin color, texture, turgor normal. No rashes or lesions Lymph nodes: Cervical, supraclavicular, and axillary nodes normal.  CBC    Component Value Date/Time   WBC 9.8 05/27/2015 1135   RBC 3.57* 05/27/2015 1135   HGB 11.5* 05/27/2015 1135   HCT 33.3* 05/27/2015 1135   PLT 239 05/27/2015 1135   MCV 93.3 05/27/2015 1135   MCH 32.2 05/27/2015 1135   MCHC 34.5 05/27/2015 1135   RDW 13.3 05/27/2015 1135    '   Chemistry      Component Value Date/Time   NA 139 05/27/2015 1135   K 5.2* 05/27/2015 1135   CL 106 05/27/2015 1135   CO2 25 05/27/2015 1135   BUN 25* 05/27/2015 1135   CREATININE 1.50* 05/27/2015 1135      Component Value Date/Time   CALCIUM 9.1 05/27/2015 1135    ,    Assessment and Plan:    82 year old gentleman with the following issues:  1. Transitional cell carcinoma of the bladder presented with muscle invasive disease and a tumor in the right posterior bladder. He is status post TURBT which showed a T2 disease and CT scan showed no evidence of lymphadenopathy or metastatic disease.  The natural course of muscle invasive bladder cancer was discussed with the patient today as well as treatment options. He is not a candidate for a radical cystectomy given his age and multiple comorbid conditions including COPD and limited pulmonary status. Bladder preserving approach would be reasonable utilizing radiation therapy and chemotherapy. He was evaluated by by Dr. Tammi Klippel and planning to start radiation therapy tentatively around 07/16/2015 for a total of 6-7 weeks. Chemotherapy is still curative at this time although the chances of cure are with this approach  compared to radical cystectomy.  The rationale for using systemic chemotherapy was discussed today. Different options of chemotherapy were reviewed including cisplatin, 5-FU as  well as mitomycin. I do not think he is a candidate for such aggressive regimen and carboplatin would be a better option. Risks and benefits of this approach was discussed. Complications include nausea, vomiting, myelosuppression, neutropenia, neutropenic sepsis, renal insufficiency, peripheral neuropathy among others.  After discussion today he is agreeable to proceed and will receive low-dose carboplatin at AUC of 2 weekly with radiation therapy tentatively starting on 07/18/2015 for total of 6 weeks. He will also attended chemotherapy education class before the start of chemotherapy.  2. Antiemetics: Prescription of Compazine was offered to him in preparation for chemotherapy. He declined and he will let us know if he develops any nausea.  3. Renal insufficiency: His creatinine is around 1.5 and this will be repeated before the start of chemotherapy.  4. Follow-up: Will be in one month to assess his tolerance to chemotherapy.

## 2015-07-05 NOTE — Progress Notes (Signed)
Please see consult note.  

## 2015-07-05 NOTE — Progress Notes (Signed)
  Radiation Oncology         [phone] ________________________________  Name: SHIRLEY DUDOIT MRN: [id number]  Date: 07/05/2015  DOB: November 12, 1928  SIMULATION AND TREATMENT PLANNING NOTE    ICD-9-CM ICD-10-CM   1. Malignant neoplasm of lateral wall of urinary bladder (HCC) 188.2 C67.2     DIAGNOSIS:  82 yo man with muscle invasive bladder cancer  NARRATIVE:  The patient was brought to the [facility].  Identity was confirmed.  All relevant records and images related to the planned course of therapy were reviewed.  The patient freely provided informed written consent to proceed with treatment after reviewing the details related to the planned course of therapy. The consent form was witnessed and verified by the simulation staff.  Then, the patient was set-up in a stable reproducible  supine position for radiation therapy.  CT images were obtained.  Surface markings were placed.  The CT images were loaded into the planning software.  Then the target and avoidance structures were contoured.  Treatment planning then occurred.  The radiation prescription was entered and confirmed.  Then, I designed and supervised the construction of a total of 5 medically necessary complex treatment devices as BodyFix positioner and 4 MLCs to shield bladder, bowel and hips.  I have requested : 3D Simulation  I have requested a DVH of the following structures: rectum, left hip, right hip, small bowel.  I have ordered:Nutrition Consult  PLAN:  The patient will receive 64.8 Gy in 36 fractions.  ________________________________  Sheral Apley Tammi Klippel, M.D.  This document serves as a record of services personally performed by Tyler Pita, MD. It was created on his behalf by Arlyce Harman, a trained medical scribe. The creation of this record is based on the scribe's personal observations and the provider's statements to them. This document has been checked and approved by the attending provider.

## 2015-07-09 ENCOUNTER — Ambulatory Visit: Payer: Medicare Other | Admitting: Radiation Oncology

## 2015-07-11 DIAGNOSIS — Z510 Encounter for antineoplastic radiation therapy: Secondary | ICD-10-CM | POA: Diagnosis not present

## 2015-07-15 ENCOUNTER — Ambulatory Visit
Admission: RE | Admit: 2015-07-15 | Discharge: 2015-07-15 | Disposition: A | Discharge status: Home / Self Care | Payer: Medicare Other | Source: Ambulatory Visit | Attending: Radiation Oncology | Admitting: Radiation Oncology

## 2015-07-15 ENCOUNTER — Ambulatory Visit: Payer: Medicare Other

## 2015-07-15 ENCOUNTER — Ambulatory Visit: Payer: Medicare Other | Admitting: Radiation Oncology

## 2015-07-15 DIAGNOSIS — Z510 Encounter for antineoplastic radiation therapy: Secondary | ICD-10-CM | POA: Diagnosis not present

## 2015-07-16 ENCOUNTER — Ambulatory Visit
Admission: RE | Admit: 2015-07-16 | Discharge: 2015-07-16 | Disposition: A | Discharge status: Home / Self Care | Payer: Medicare Other | Source: Ambulatory Visit | Attending: Radiation Oncology | Admitting: Radiation Oncology

## 2015-07-16 DIAGNOSIS — Z510 Encounter for antineoplastic radiation therapy: Secondary | ICD-10-CM | POA: Diagnosis not present

## 2015-07-17 ENCOUNTER — Encounter: Payer: Self-pay | Admitting: *Deleted

## 2015-07-17 ENCOUNTER — Other Ambulatory Visit: Payer: Medicare Other

## 2015-07-17 ENCOUNTER — Ambulatory Visit
Admission: RE | Admit: 2015-07-17 | Discharge: 2015-07-17 | Disposition: A | Discharge status: Home / Self Care | Payer: Medicare Other | Source: Ambulatory Visit | Attending: Radiation Oncology | Admitting: Radiation Oncology

## 2015-07-17 DIAGNOSIS — Z510 Encounter for antineoplastic radiation therapy: Secondary | ICD-10-CM | POA: Diagnosis not present

## 2015-07-18 ENCOUNTER — Other Ambulatory Visit (HOSPITAL_BASED_OUTPATIENT_CLINIC_OR_DEPARTMENT_OTHER): Payer: Medicare Other

## 2015-07-18 ENCOUNTER — Ambulatory Visit
Admission: RE | Admit: 2015-07-18 | Discharge: 2015-07-18 | Disposition: A | Discharge status: Home / Self Care | Payer: Medicare Other | Source: Ambulatory Visit | Attending: Radiation Oncology | Admitting: Radiation Oncology

## 2015-07-18 ENCOUNTER — Ambulatory Visit (HOSPITAL_BASED_OUTPATIENT_CLINIC_OR_DEPARTMENT_OTHER): Payer: Medicare Other

## 2015-07-18 VITALS — BP 111/59 | HR 80 | Temp 98.1°F | Resp 16

## 2015-07-18 DIAGNOSIS — Z5111 Encounter for antineoplastic chemotherapy: Secondary | ICD-10-CM

## 2015-07-18 DIAGNOSIS — C679 Malignant neoplasm of bladder, unspecified: Secondary | ICD-10-CM | POA: Diagnosis present

## 2015-07-18 DIAGNOSIS — Z510 Encounter for antineoplastic radiation therapy: Secondary | ICD-10-CM | POA: Diagnosis not present

## 2015-07-18 LAB — COMPREHENSIVE METABOLIC PANEL
ALT: 15 U/L (ref 0–55)
ANION GAP: 5 meq/L (ref 3–11)
AST: 18 U/L (ref 5–34)
Albumin: 3.5 g/dL (ref 3.5–5.0)
Alkaline Phosphatase: 53 U/L (ref 40–150)
BUN: 23.8 mg/dL (ref 7.0–26.0)
CHLORIDE: 109 meq/L (ref 98–109)
CO2: 27 meq/L (ref 22–29)
CREATININE: 1.5 mg/dL — AB (ref 0.7–1.3)
Calcium: 9.3 mg/dL (ref 8.4–10.4)
EGFR: 42 mL/min/{1.73_m2} — ABNORMAL LOW (ref >90)
Glucose: 109 mg/dl (ref 70–140)
Potassium: 4.6 mEq/L (ref 3.5–5.1)
Sodium: 140 mEq/L (ref 136–145)
Total Bilirubin: 0.61 mg/dL (ref 0.20–1.20)
Total Protein: 6.8 g/dL (ref 6.4–8.3)

## 2015-07-18 LAB — CBC WITH DIFFERENTIAL/PLATELET
BASO%: 0.1 % (ref 0.0–2.0)
Basophils Absolute: 0 10*3/uL (ref 0.0–0.1)
EOS%: 1.9 % (ref 0.0–7.0)
Eosinophils Absolute: 0.1 10*3/uL (ref 0.0–0.5)
HCT: 33.3 % — ABNORMAL LOW (ref 38.4–49.9)
HGB: 11.5 g/dL — ABNORMAL LOW (ref 13.0–17.1)
LYMPH%: 28 % (ref 14.0–49.0)
MCH: 32.4 pg (ref 27.2–33.4)
MCHC: 34.5 g/dL (ref 32.0–36.0)
MCV: 93.8 fL (ref 79.3–98.0)
MONO#: 0.7 10*3/uL (ref 0.1–0.9)
MONO%: 9.5 % (ref 0.0–14.0)
NEUT#: 4.2 10*3/uL (ref 1.5–6.5)
NEUT%: 60.5 % (ref 39.0–75.0)
PLATELETS: 236 10*3/uL (ref 140–400)
RBC: 3.55 10*6/uL — AB (ref 4.20–5.82)
RDW: 13.6 % (ref 11.0–14.6)
WBC: 7 10*3/uL (ref 4.0–10.3)
lymph#: 2 10*3/uL (ref 0.9–3.3)

## 2015-07-18 MED ORDER — SODIUM CHLORIDE 0.9 % IV SOLN
10.0000 mg | Freq: Once | INTRAVENOUS | Status: AC
  Administered 2015-07-18: 10 mg via INTRAVENOUS
  Filled 2015-07-18: qty 1

## 2015-07-18 MED ORDER — PALONOSETRON HCL INJECTION 0.25 MG/5ML
0.2500 mg | Freq: Once | INTRAVENOUS | Status: AC
  Administered 2015-07-18: 0.25 mg via INTRAVENOUS

## 2015-07-18 MED ORDER — SODIUM CHLORIDE 0.9 % IV SOLN
Freq: Once | INTRAVENOUS | Status: AC
  Administered 2015-07-18: 15:00:00 via INTRAVENOUS

## 2015-07-18 MED ORDER — PALONOSETRON HCL INJECTION 0.25 MG/5ML
INTRAVENOUS | Status: AC
  Filled 2015-07-18: qty 5

## 2015-07-18 MED ORDER — SODIUM CHLORIDE 0.9 % IV SOLN
140.0000 mg | Freq: Once | INTRAVENOUS | Status: AC
  Administered 2015-07-18: 140 mg via INTRAVENOUS
  Filled 2015-07-18: qty 14

## 2015-07-18 NOTE — Patient Instructions (Signed)
[facility] Discharge Instructions for Patients Receiving Chemotherapy  Today you received the following chemotherapy agents carboplatin  To help prevent nausea and vomiting after your treatment, we encourage you to take your nausea medication as directed   If you develop nausea and vomiting that is not controlled by your nausea medication, call the clinic.   BELOW ARE SYMPTOMS THAT SHOULD BE REPORTED IMMEDIATELY:  *FEVER GREATER THAN 100.5 F  *CHILLS WITH OR WITHOUT FEVER  NAUSEA AND VOMITING THAT IS NOT CONTROLLED WITH YOUR NAUSEA MEDICATION  *UNUSUAL SHORTNESS OF BREATH  *UNUSUAL BRUISING OR BLEEDING  TENDERNESS IN MOUTH AND THROAT WITH OR WITHOUT PRESENCE OF ULCERS  *URINARY PROBLEMS  *BOWEL PROBLEMS  UNUSUAL RASH Items with * indicate a potential emergency and should be followed up as soon as possible.  Feel free to call the clinic you have any questions or concerns. The clinic phone number is [phone].  Carboplatin injection What is this medicine? CARBOPLATIN (KAR boe pla tin) is a chemotherapy drug. It targets fast dividing cells, like cancer cells, and causes these cells to die. This medicine is used to treat ovarian cancer and many other cancers. This medicine may be used for other purposes; ask your health care provider or pharmacist if you have questions. What should I tell my health care provider before I take this medicine? They need to know if you have any of these conditions: -blood disorders -hearing problems -kidney disease -recent or ongoing radiation therapy -an unusual or allergic reaction to carboplatin, cisplatin, other chemotherapy, other medicines, foods, dyes, or preservatives -pregnant or trying to get pregnant -breast-feeding How should I use this medicine? This drug is usually given as an infusion into a vein. It is administered in a hospital or clinic by a specially trained health care professional. Talk to your  pediatrician regarding the use of this medicine in children. Special care may be needed. Overdosage: If you think you have taken too much of this medicine contact a poison control center or emergency room at once. NOTE: This medicine is only for you. Do not share this medicine with others. What if I miss a dose? It is important not to miss a dose. Call your doctor or health care professional if you are unable to keep an appointment. What may interact with this medicine? -medicines for seizures -medicines to increase blood counts like filgrastim, pegfilgrastim, sargramostim -some antibiotics like amikacin, gentamicin, neomycin, streptomycin, tobramycin -vaccines Talk to your doctor or health care professional before taking any of these medicines: -acetaminophen -aspirin -ibuprofen -ketoprofen -naproxen This list may not describe all possible interactions. Give your health care provider a list of all the medicines, herbs, non-prescription drugs, or dietary supplements you use. Also tell them if you smoke, drink alcohol, or use illegal drugs. Some items may interact with your medicine. What should I watch for while using this medicine? Your condition will be monitored carefully while you are receiving this medicine. You will need important blood work done while you are taking this medicine. This drug may make you feel generally unwell. This is not uncommon, as chemotherapy can affect healthy cells as well as cancer cells. Report any side effects. Continue your course of treatment even though you feel ill unless your doctor tells you to stop. In some cases, you may be given additional medicines to help with side effects. Follow all directions for their use. Call your doctor or health care professional for advice if you get a fever, chills or sore  throat, or other symptoms of a cold or flu. Do not treat yourself. This drug decreases your body's ability to fight infections. Try to avoid being around  people who are sick. This medicine may increase your risk to bruise or bleed. Call your doctor or health care professional if you notice any unusual bleeding. Be careful brushing and flossing your teeth or using a toothpick because you may get an infection or bleed more easily. If you have any dental work done, tell your dentist you are receiving this medicine. Avoid taking products that contain aspirin, acetaminophen, ibuprofen, naproxen, or ketoprofen unless instructed by your doctor. These medicines may hide a fever. Do not become pregnant while taking this medicine. Women should inform their doctor if they wish to become pregnant or think they might be pregnant. There is a potential for serious side effects to an unborn child. Talk to your health care professional or pharmacist for more information. Do not breast-feed an infant while taking this medicine. What side effects may I notice from receiving this medicine? Side effects that you should report to your doctor or health care professional as soon as possible: -allergic reactions like skin rash, itching or hives, swelling of the face, lips, or tongue -signs of infection - fever or chills, cough, sore throat, pain or difficulty passing urine -signs of decreased platelets or bleeding - bruising, pinpoint red spots on the skin, black, tarry stools, nosebleeds -signs of decreased red blood cells - unusually weak or tired, fainting spells, lightheadedness -breathing problems -changes in hearing -changes in vision -chest pain -high blood pressure -low blood counts - This drug may decrease the number of white blood cells, red blood cells and platelets. You may be at increased risk for infections and bleeding. -nausea and vomiting -pain, swelling, redness or irritation at the injection site -pain, tingling, numbness in the hands or feet -problems with balance, talking, walking -trouble passing urine or change in the amount of urine Side effects  that usually do not require medical attention (report to your doctor or health care professional if they continue or are bothersome): -hair loss -loss of appetite -metallic taste in the mouth or changes in taste This list may not describe all possible side effects. Call your doctor for medical advice about side effects. You may report side effects to FDA at 1-800-FDA-1088. Where should I keep my medicine? This drug is given in a hospital or clinic and will not be stored at home. NOTE: This sheet is a summary. It may not cover all possible information. If you have questions about this medicine, talk to your doctor, pharmacist, or health care provider.    2016, Elsevier/Gold Standard. (2007-05-24 14:38:05)

## 2015-07-19 ENCOUNTER — Telehealth: Payer: Self-pay | Admitting: *Deleted

## 2015-07-19 ENCOUNTER — Ambulatory Visit
Admission: RE | Admit: 2015-07-19 | Discharge: 2015-07-19 | Disposition: A | Discharge status: Home / Self Care | Payer: Medicare Other | Source: Ambulatory Visit | Attending: Radiation Oncology | Admitting: Radiation Oncology

## 2015-07-19 ENCOUNTER — Encounter: Payer: Self-pay | Admitting: Radiation Oncology

## 2015-07-19 ENCOUNTER — Telehealth: Payer: Self-pay | Admitting: Radiation Oncology

## 2015-07-19 VITALS — BP 107/57 | HR 72 | Resp 16 | Wt 174.6 lb

## 2015-07-19 DIAGNOSIS — C672 Malignant neoplasm of lateral wall of bladder: Secondary | ICD-10-CM

## 2015-07-19 DIAGNOSIS — Z510 Encounter for antineoplastic radiation therapy: Secondary | ICD-10-CM | POA: Diagnosis not present

## 2015-07-19 NOTE — Telephone Encounter (Signed)
-----   Message from Arty Baumgartner, RN sent at 07/18/2015  4:01 PM EDT ----- Regarding: First time chemo/Shadad First time carboplatin.  Dr. Alen Blew.  Pt tolerated well.

## 2015-07-19 NOTE — Telephone Encounter (Signed)
L/m for patient to call me  

## 2015-07-19 NOTE — Telephone Encounter (Signed)
Opened in error

## 2015-07-19 NOTE — Telephone Encounter (Signed)
Patient returned call for chemo f/u. Patient eating and drinking well, denies any nausea or diarrhea. He has some constipation and will take a stool softener and increase his water intake. Advised to call with any questions or concerns. He verbalized understanding.

## 2015-07-19 NOTE — Progress Notes (Signed)
Patient reports his SOB is such that he can't make his bed or roll his trash can to the road without stopping to catch his breath. Sitting pulse ox 100%. Pulse ox dropped to 89% with ambulation. Dr. Tammi Klippel informed of these findings.
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
## 2015-07-22 ENCOUNTER — Ambulatory Visit
Admission: RE | Admit: 2015-07-22 | Discharge: 2015-07-22 | Disposition: A | Discharge status: Home / Self Care | Payer: Medicare Other | Source: Ambulatory Visit | Attending: Radiation Oncology | Admitting: Radiation Oncology

## 2015-07-22 DIAGNOSIS — Z510 Encounter for antineoplastic radiation therapy: Secondary | ICD-10-CM | POA: Diagnosis not present

## 2015-07-23 ENCOUNTER — Ambulatory Visit
Admission: RE | Admit: 2015-07-23 | Discharge: 2015-07-23 | Disposition: A | Discharge status: Home / Self Care | Payer: Medicare Other | Source: Ambulatory Visit | Attending: Radiation Oncology | Admitting: Radiation Oncology

## 2015-07-23 DIAGNOSIS — Z510 Encounter for antineoplastic radiation therapy: Secondary | ICD-10-CM | POA: Diagnosis not present

## 2015-07-24 ENCOUNTER — Ambulatory Visit
Admission: RE | Admit: 2015-07-24 | Discharge: 2015-07-24 | Disposition: A | Discharge status: Home / Self Care | Payer: Medicare Other | Source: Ambulatory Visit | Attending: Radiation Oncology | Admitting: Radiation Oncology

## 2015-07-24 DIAGNOSIS — Z510 Encounter for antineoplastic radiation therapy: Secondary | ICD-10-CM | POA: Diagnosis not present

## 2015-07-25 ENCOUNTER — Other Ambulatory Visit (HOSPITAL_BASED_OUTPATIENT_CLINIC_OR_DEPARTMENT_OTHER): Payer: Medicare Other

## 2015-07-25 ENCOUNTER — Encounter: Payer: Self-pay | Admitting: Oncology

## 2015-07-25 ENCOUNTER — Ambulatory Visit
Admission: RE | Admit: 2015-07-25 | Discharge: 2015-07-25 | Disposition: A | Discharge status: Home / Self Care | Payer: Medicare Other | Source: Ambulatory Visit | Attending: Radiation Oncology | Admitting: Radiation Oncology

## 2015-07-25 ENCOUNTER — Ambulatory Visit (HOSPITAL_BASED_OUTPATIENT_CLINIC_OR_DEPARTMENT_OTHER): Payer: Medicare Other

## 2015-07-25 VITALS — BP 106/63 | HR 76 | Temp 98.2°F | Resp 16

## 2015-07-25 DIAGNOSIS — C679 Malignant neoplasm of bladder, unspecified: Secondary | ICD-10-CM

## 2015-07-25 DIAGNOSIS — Z510 Encounter for antineoplastic radiation therapy: Secondary | ICD-10-CM | POA: Diagnosis not present

## 2015-07-25 DIAGNOSIS — Z5111 Encounter for antineoplastic chemotherapy: Secondary | ICD-10-CM | POA: Diagnosis present

## 2015-07-25 LAB — COMPREHENSIVE METABOLIC PANEL
ALBUMIN: 3.6 g/dL (ref 3.5–5.0)
ALK PHOS: 49 U/L (ref 40–150)
ALT: 15 U/L (ref 0–55)
ANION GAP: 8 meq/L (ref 3–11)
AST: 18 U/L (ref 5–34)
BUN: 25.7 mg/dL (ref 7.0–26.0)
CO2: 23 mEq/L (ref 22–29)
Calcium: 9.2 mg/dL (ref 8.4–10.4)
Chloride: 107 mEq/L (ref 98–109)
Creatinine: 1.5 mg/dL — ABNORMAL HIGH (ref 0.7–1.3)
EGFR: 43 mL/min/{1.73_m2} — AB (ref >90)
GLUCOSE: 106 mg/dL (ref 70–140)
POTASSIUM: 4.6 meq/L (ref 3.5–5.1)
SODIUM: 138 meq/L (ref 136–145)
Total Bilirubin: 0.62 mg/dL (ref 0.20–1.20)
Total Protein: 6.8 g/dL (ref 6.4–8.3)

## 2015-07-25 LAB — CBC WITH DIFFERENTIAL/PLATELET
BASO%: 0.2 % (ref 0.0–2.0)
BASOS ABS: 0 10*3/uL (ref 0.0–0.1)
EOS ABS: 0.4 10*3/uL (ref 0.0–0.5)
EOS%: 7.2 % — ABNORMAL HIGH (ref 0.0–7.0)
HCT: 32.8 % — ABNORMAL LOW (ref 38.4–49.9)
HEMOGLOBIN: 11.2 g/dL — AB (ref 13.0–17.1)
LYMPH%: 24.9 % (ref 14.0–49.0)
MCH: 32.1 pg (ref 27.2–33.4)
MCHC: 34.1 g/dL (ref 32.0–36.0)
MCV: 94 fL (ref 79.3–98.0)
MONO#: 0.4 10*3/uL (ref 0.1–0.9)
MONO%: 7 % (ref 0.0–14.0)
NEUT#: 3.7 10*3/uL (ref 1.5–6.5)
NEUT%: 60.7 % (ref 39.0–75.0)
PLATELETS: 189 10*3/uL (ref 140–400)
RBC: 3.49 10*6/uL — ABNORMAL LOW (ref 4.20–5.82)
RDW: 13.6 % (ref 11.0–14.6)
WBC: 6.1 10*3/uL (ref 4.0–10.3)
lymph#: 1.5 10*3/uL (ref 0.9–3.3)

## 2015-07-25 MED ORDER — PALONOSETRON HCL INJECTION 0.25 MG/5ML
INTRAVENOUS | Status: AC
  Filled 2015-07-25: qty 5

## 2015-07-25 MED ORDER — SODIUM CHLORIDE 0.9 % IV SOLN: 200.0000 mg | Freq: Once | INTRAVENOUS | Status: DC

## 2015-07-25 MED ORDER — PALONOSETRON HCL INJECTION 0.25 MG/5ML
0.2500 mg | Freq: Once | INTRAVENOUS | Status: AC
  Administered 2015-07-25: 0.25 mg via INTRAVENOUS

## 2015-07-25 MED ORDER — CARBOPLATIN CHEMO INJECTION 450 MG/45ML
140.0000 mg | Freq: Once | INTRAVENOUS | Status: AC
  Administered 2015-07-25: 140 mg via INTRAVENOUS
  Filled 2015-07-25: qty 14

## 2015-07-25 MED ORDER — SODIUM CHLORIDE 0.9 % IV SOLN
10.0000 mg | Freq: Once | INTRAVENOUS | Status: AC
  Administered 2015-07-25: 10 mg via INTRAVENOUS
  Filled 2015-07-25: qty 1

## 2015-07-25 MED ORDER — SODIUM CHLORIDE 0.9 % IV SOLN
Freq: Once | INTRAVENOUS | Status: AC
  Administered 2015-07-25: 13:00:00 via INTRAVENOUS

## 2015-07-25 NOTE — Patient Instructions (Signed)
[facility] Discharge Instructions for Patients Receiving Chemotherapy  Today you received the following chemotherapy agents Carboplatin  To help prevent nausea and vomiting after your treatment, we encourage you to take your nausea medication   If you develop nausea and vomiting that is not controlled by your nausea medication, call the clinic.   BELOW ARE SYMPTOMS THAT SHOULD BE REPORTED IMMEDIATELY:  *FEVER GREATER THAN 100.5 F  *CHILLS WITH OR WITHOUT FEVER  NAUSEA AND VOMITING THAT IS NOT CONTROLLED WITH YOUR NAUSEA MEDICATION  *UNUSUAL SHORTNESS OF BREATH  *UNUSUAL BRUISING OR BLEEDING  TENDERNESS IN MOUTH AND THROAT WITH OR WITHOUT PRESENCE OF ULCERS  *URINARY PROBLEMS  *BOWEL PROBLEMS  UNUSUAL RASH Items with * indicate a potential emergency and should be followed up as soon as possible.  Feel free to call the clinic you have any questions or concerns. The clinic phone number is [phone].  Please show the CHEMO ALERT CARD at check-in to the Emergency Department and triage nurse.   

## 2015-07-25 NOTE — Progress Notes (Signed)
Introduced myself as his FA.  Pt has 2 insurances so copay assistance may not be needed.  Pt will be coming everyday for radiation so he can benefit from the [place].  He will bring his bank statement on 07/30/15 to see if he qualifies.  I gave him an expense sheet & my card for any questions or concerns he may have in the future.

## 2015-07-26 ENCOUNTER — Ambulatory Visit
Admission: RE | Admit: 2015-07-26 | Discharge: 2015-07-26 | Disposition: A | Discharge status: Home / Self Care | Payer: Medicare Other | Source: Ambulatory Visit | Attending: Radiation Oncology | Admitting: Radiation Oncology

## 2015-07-26 ENCOUNTER — Encounter: Payer: Self-pay | Admitting: Radiation Oncology

## 2015-07-26 VITALS — BP 117/63 | HR 72 | Temp 97.6°F | Ht 72.0 in | Wt 173.6 lb

## 2015-07-26 DIAGNOSIS — C672 Malignant neoplasm of lateral wall of bladder: Secondary | ICD-10-CM

## 2015-07-26 DIAGNOSIS — Z510 Encounter for antineoplastic radiation therapy: Secondary | ICD-10-CM | POA: Diagnosis not present

## 2015-07-26 NOTE — Progress Notes (Signed)
Mr. Koonz reports  Increasing fatigue which he feels is not related to treatment.  Recently had a stress test prior to start of XRT which was okay, but he does not feel this is true and is concerned.  Voiding without any issues and emptying completely with Flomax. Reports "stinging" at the beginning of his stream. Nocturia x 1.  Denies nay skin changes in the tx field

## 2015-07-26 NOTE — Progress Notes (Signed)
  Radiation Oncology         [phone]   Name: BIFF CHARON MRN: [id number]   Date: 07/26/2015  DOB: 1928-05-06     Weekly Radiation Therapy Management    ICD-9-CM ICD-10-CM   1. Malignant neoplasm of lateral wall of urinary bladder (HCC) 188.2 C67.2     Current Dose: 18 Gy  Planned Dose:  64.8 Gy  Narrative The patient presents for routine under treatment assessment.   Mr. Rushlow reports Increasing fatigue which he feels is not related to treatment. Recently had a stress test prior to start of XRT which was okay, but he does not feel this is true and is concerned. Voiding without any issues and emptying completely with Flomax. Reports "stinging" at the beginning of his stream. Nocturia x 1. Denies nay skin changes in the tx field.  The patient is without complaint. Set-up films were reviewed. The chart was checked.  Physical Findings  height is 6' (1.829 m) and weight is 173 lb 9.6 oz (78.744 kg). His temperature is 97.6 F (36.4 C). His blood pressure is 117/63 and his pulse is 72. His oxygen saturation is 98%. . Weight essentially stable.  No significant changes.  Impression The patient is tolerating radiation. We discussed him using supplemental oxygen if needed during his visit at the cancer center.   Plan Continue treatment as planned.       Sheral Apley Tammi Klippel, M.D.    This document serves as a record of services personally performed by Tyler Pita, MD. It was created on his behalf by Lendon Collar, a trained medical scribe. The creation of this record is based on the scribe's personal observations and the provider's statements to them. This document has been checked and approved by the attending provider.

## 2015-07-30 ENCOUNTER — Ambulatory Visit
Admission: RE | Admit: 2015-07-30 | Discharge: 2015-07-30 | Disposition: A | Discharge status: Home / Self Care | Payer: Medicare Other | Source: Ambulatory Visit | Attending: Radiation Oncology | Admitting: Radiation Oncology

## 2015-07-30 DIAGNOSIS — Z510 Encounter for antineoplastic radiation therapy: Secondary | ICD-10-CM | POA: Diagnosis not present

## 2015-07-30 NOTE — Progress Notes (Addendum)
Mr. Overgaard reports constipation since having Chemotherapy with Carboplatin and Aloxi this past Friday.  Minimal relief with Colace and Miralax. Encouraged to increase fluid intake and also instructed to take a Fleets enema to facilitate evacuation of stool with management by taking Senokot-S and Miralax for relief, as per the Constipation Management form, given by Joaquim Lai, RN. Note postive bowel sounds bilaterally without any abdominal bloating.  VSS

## 2015-07-31 ENCOUNTER — Ambulatory Visit
Admission: RE | Admit: 2015-07-31 | Discharge: 2015-07-31 | Disposition: A | Discharge status: Home / Self Care | Payer: Medicare Other | Source: Ambulatory Visit | Attending: Radiation Oncology | Admitting: Radiation Oncology

## 2015-07-31 DIAGNOSIS — Z510 Encounter for antineoplastic radiation therapy: Secondary | ICD-10-CM | POA: Diagnosis not present

## 2015-07-31 NOTE — Progress Notes (Signed)
1140 Received patient in the clinic today following radiation treatment. Patient verbalized he "didn't have much luck with the enema" but, the magnesium citrate "really did the trick." Patient reports several bowel movements during the night and into the morning. Instructed patient to increase fluid intake for today and not take any Miralax, magnesium citrate, or senna s. Explained the patient may resume Miralax tomorrow. Patient reports rectal irriation. Offered patient a sitz bath but, he politely refused. Encouraged patient to use baby wipes with aloe. Patient states, "I am going to ask them tomorrow if I have to take the Aloxi." Explained this is a decision for the medical oncologist. Encouraged patient to notify staff before several days pass without a bowel movement next time so that a more gentler approach (MOM, etc.) could be considered. Patient verbalized understanding.

## 2015-08-01 ENCOUNTER — Ambulatory Visit (HOSPITAL_BASED_OUTPATIENT_CLINIC_OR_DEPARTMENT_OTHER): Payer: Medicare Other | Admitting: Oncology

## 2015-08-01 ENCOUNTER — Ambulatory Visit
Admission: RE | Admit: 2015-08-01 | Discharge: 2015-08-01 | Disposition: A | Discharge status: Home / Self Care | Payer: Medicare Other | Source: Ambulatory Visit | Attending: Radiation Oncology | Admitting: Radiation Oncology

## 2015-08-01 ENCOUNTER — Ambulatory Visit (HOSPITAL_BASED_OUTPATIENT_CLINIC_OR_DEPARTMENT_OTHER): Payer: Medicare Other

## 2015-08-01 ENCOUNTER — Telehealth: Payer: Self-pay | Admitting: *Deleted

## 2015-08-01 ENCOUNTER — Other Ambulatory Visit (HOSPITAL_BASED_OUTPATIENT_CLINIC_OR_DEPARTMENT_OTHER): Payer: Medicare Other

## 2015-08-01 VITALS — BP 108/64 | HR 85 | Temp 97.7°F | Resp 17 | Ht 72.0 in | Wt 172.4 lb

## 2015-08-01 DIAGNOSIS — K5900 Constipation, unspecified: Secondary | ICD-10-CM | POA: Diagnosis not present

## 2015-08-01 DIAGNOSIS — Z5111 Encounter for antineoplastic chemotherapy: Secondary | ICD-10-CM

## 2015-08-01 DIAGNOSIS — C679 Malignant neoplasm of bladder, unspecified: Secondary | ICD-10-CM

## 2015-08-01 DIAGNOSIS — N289 Disorder of kidney and ureter, unspecified: Secondary | ICD-10-CM

## 2015-08-01 DIAGNOSIS — Z510 Encounter for antineoplastic radiation therapy: Secondary | ICD-10-CM | POA: Diagnosis not present

## 2015-08-01 DIAGNOSIS — C672 Malignant neoplasm of lateral wall of bladder: Secondary | ICD-10-CM

## 2015-08-01 LAB — CBC WITH DIFFERENTIAL/PLATELET
BASO%: 0.3 % (ref 0.0–2.0)
BASOS ABS: 0 10*3/uL (ref 0.0–0.1)
EOS%: 8.9 % — AB (ref 0.0–7.0)
Eosinophils Absolute: 0.5 10*3/uL (ref 0.0–0.5)
HCT: 31.6 % — ABNORMAL LOW (ref 38.4–49.9)
HGB: 10.7 g/dL — ABNORMAL LOW (ref 13.0–17.1)
LYMPH%: 16.5 % (ref 14.0–49.0)
MCH: 32.1 pg (ref 27.2–33.4)
MCHC: 33.8 g/dL (ref 32.0–36.0)
MCV: 95 fL (ref 79.3–98.0)
MONO#: 0.5 10*3/uL (ref 0.1–0.9)
MONO%: 9.2 % (ref 0.0–14.0)
NEUT#: 3.7 10*3/uL (ref 1.5–6.5)
NEUT%: 65.1 % (ref 39.0–75.0)
PLATELETS: 141 10*3/uL (ref 140–400)
RBC: 3.32 10*6/uL — AB (ref 4.20–5.82)
RDW: 13.6 % (ref 11.0–14.6)
WBC: 5.6 10*3/uL (ref 4.0–10.3)
lymph#: 0.9 10*3/uL (ref 0.9–3.3)

## 2015-08-01 LAB — COMPREHENSIVE METABOLIC PANEL
ALK PHOS: 44 U/L (ref 40–150)
ALT: 14 U/L (ref 0–55)
ANION GAP: 7 meq/L (ref 3–11)
AST: 20 U/L (ref 5–34)
Albumin: 3.5 g/dL (ref 3.5–5.0)
BILIRUBIN TOTAL: 0.54 mg/dL (ref 0.20–1.20)
BUN: 25.4 mg/dL (ref 7.0–26.0)
CALCIUM: 8.9 mg/dL (ref 8.4–10.4)
CHLORIDE: 107 meq/L (ref 98–109)
CO2: 26 meq/L (ref 22–29)
CREATININE: 1.4 mg/dL — AB (ref 0.7–1.3)
EGFR: 45 mL/min/{1.73_m2} — AB (ref >90)
Glucose: 92 mg/dl (ref 70–140)
POTASSIUM: 4.4 meq/L (ref 3.5–5.1)
Sodium: 139 mEq/L (ref 136–145)
Total Protein: 6.5 g/dL (ref 6.4–8.3)

## 2015-08-01 MED ORDER — SODIUM CHLORIDE 0.9 % IV SOLN
138.2000 mg | Freq: Once | INTRAVENOUS | Status: AC
  Administered 2015-08-01: 140 mg via INTRAVENOUS
  Filled 2015-08-01: qty 14

## 2015-08-01 MED ORDER — PALONOSETRON HCL INJECTION 0.25 MG/5ML
0.2500 mg | Freq: Once | INTRAVENOUS | Status: AC
  Administered 2015-08-01: 0.25 mg via INTRAVENOUS

## 2015-08-01 MED ORDER — PALONOSETRON HCL INJECTION 0.25 MG/5ML
INTRAVENOUS | Status: AC
  Filled 2015-08-01: qty 5

## 2015-08-01 MED ORDER — DEXAMETHASONE SODIUM PHOSPHATE 100 MG/10ML IJ SOLN
10.0000 mg | Freq: Once | INTRAMUSCULAR | Status: AC
  Administered 2015-08-01: 10 mg via INTRAVENOUS
  Filled 2015-08-01: qty 1

## 2015-08-01 MED ORDER — SODIUM CHLORIDE 0.9 % IV SOLN
Freq: Once | INTRAVENOUS | Status: AC
  Administered 2015-08-01: 13:00:00 via INTRAVENOUS

## 2015-08-01 NOTE — Progress Notes (Signed)
Hematology and Oncology Follow Up Visit  LADARRYL MCCUSKEY [id number] [birth year] 82 y.o. 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    Prior Therapy: On 05/28/2015 he underwent TURBT and the pathology showed high-grade invasive urothelial carcinoma with invasion into the muscularis propria. CT scan of the abdomen and pelvis on 05/27/2015 showed a dominant enhancing lesion in the right posterior lateral aspect of the bladder wall but no evidence of any adenopathy  Current therapy: Definitive radiation therapy with weekly carboplatin started on 07/18/2015.  Interim History: Mr. Hefel presents today for a follow-up visit. Since the last visit, he received 2 weeks of carboplatin infusion with daily radiation and a half tolerated it well. He denied any nausea or vomiting but did report constipation. His constipation starts the day after chemotherapy and last for 3 days. He has started using stool softeners and did require a laxative at times. Bowel habits are regular at this time. He does not report any other complications she is fatigue, tiredness or neuropathy. He continues to perform activities of daily living without any decline.  He is not report any headaches, blurry vision, syncope or seizures. He does not report any fevers, chills or sweats. He does not report any cough, wheezing or hemoptysis. Does not report any nausea, vomiting, abdominal pain, hematochezia or melena. He does not report any frequency, urgency or hesitancy. Does not report any skeletal complaints. Remaining review of systems unremarkable.  Medications: I have reviewed the patient's current medications.  Current Outpatient Prescriptions  Medication Sig Dispense Refill  . albuterol (PROVENTIL HFA;VENTOLIN HFA) 108 (90 BASE) MCG/ACT inhaler Inhale 2 puffs into the  lungs every 6 (six) hours as needed for wheezing or shortness of breath.    Marland Kitchen aspirin EC 81 MG tablet Take 81 mg by mouth every morning.     . Calcium Carbonate-Vitamin D (CALCIUM 600+D) 600-400 MG-UNIT tablet Take 1 tablet by mouth daily.    . camphor-menthol (SARNA) lotion Apply 1 application topically 2 (two) times daily as needed for itching.    . cetirizine (ZYRTEC) 10 MG tablet Take 10 mg by mouth every morning.     . cycloSPORINE (RESTASIS) 0.05 % ophthalmic emulsion Place 1 drop into both eyes 2 (two) times daily as needed (Dry eyes).     Marland Kitchen esomeprazole (NEXIUM) 40 MG capsule Take 40 mg by mouth every evening.     . fluticasone (FLONASE) 50 MCG/ACT nasal spray Place 1 spray into both nostrils daily as needed for allergies or rhinitis.    . Fluticasone-Salmeterol (ADVAIR) 250-50 MCG/DOSE AEPB Inhale 1 puff into the lungs 2 (two) times daily.    Marland Kitchen ketoconazole (NIZORAL) 2 % cream Apply 1 application topically 2 (two) times daily.     . Multiple Vitamin (MULTIVITAMIN WITH MINERALS) TABS tablet Take 1 tablet by mouth every morning.     . Omega-3 Fatty Acids (FISH OIL) 1000 MG CAPS Take 1,000 mg by mouth 2 (two) times daily.    . OXYGEN Inhale into the lungs. 2 LITERS AT NIGHT    . Psyllium (METAMUCIL) WAFR Take 2 Wafers by mouth daily.     . simvastatin (ZOCOR) 40 MG tablet Take 40 mg by mouth every evening.     . tamsulosin (FLOMAX) 0.4 MG CAPS capsule Take 0.4 mg by mouth every evening.     . traMADol (ULTRAM) 50  MG tablet Take 1 tablet (50 mg total) by mouth every 6 (six) hours as needed for moderate pain. 20 tablet 0  . triamcinolone cream (KENALOG) 0.1 % Apply 1 application topically daily as needed (Ithcing).      No current facility-administered medications for this visit.   Facility-Administered Medications Ordered in Other Visits  Medication Dose Route Frequency Provider Last Rate Last Dose  . epirubicin (ELLENCE) 50 mg in sodium chloride 0.9 % bladder instillation  50 mg Bladder  Instillation Once Festus Aloe, MD         Allergies:  Allergies  Allergen Reactions  . Hibiclens [Chlorhexidine] Itching, Rash and Other (See Comments)    Patient stated,"it burns my skin."    Past Medical History, Surgical history, Social history, and Family History were reviewed and updated.  Review of systems: Please see history of present illness.  Physical Exam: Blood pressure 108/64, pulse 85, temperature 97.7 F (36.5 C), temperature source Oral, resp. rate 17, height 6' (1.829 m), weight 172 lb 6.4 oz (78.2 kg), SpO2 96 %. ECOG: 0 General appearance: alert and cooperative Head: Normocephalic, without obvious abnormality Neck: no adenopathy Lymph nodes: Cervical, supraclavicular, and axillary nodes normal. Heart:regular rate and rhythm, S1, S2 normal, no murmur, click, rub or gallop Lung:chest clear, no wheezing, rales, normal symmetric air entry Abdomin: soft, non-tender, without masses or organomegaly EXT:no erythema, induration, or nodules   Lab Results: Lab Results  Component Value Date   WBC 5.6 08/01/2015   HGB 10.7* 08/01/2015   HCT 31.6* 08/01/2015   MCV 95.0 08/01/2015   PLT 141 08/01/2015     Chemistry      Component Value Date/Time   NA 139 08/01/2015 1120   NA 139 05/27/2015 1135   K 4.4 08/01/2015 1120   K 5.2* 05/27/2015 1135   CL 106 05/27/2015 1135   CO2 26 08/01/2015 1120   CO2 25 05/27/2015 1135   BUN 25.4 08/01/2015 1120   BUN 25* 05/27/2015 1135   CREATININE 1.4* 08/01/2015 1120   CREATININE 1.50* 05/27/2015 1135      Component Value Date/Time   CALCIUM 8.9 08/01/2015 1120   CALCIUM 9.1 05/27/2015 1135   ALKPHOS 44 08/01/2015 1120   AST 20 08/01/2015 1120   ALT 14 08/01/2015 1120   BILITOT 0.54 08/01/2015 1120         Impression and Plan:  82 year old gentleman with the following issues:  1. Transitional cell carcinoma of the bladder presented with muscle invasive disease and a tumor in the right posterior bladder.  He is status post TURBT which showed a T2 disease and CT scan showed no evidence of lymphadenopathy or metastatic disease.  He is currently receiving definitive radiation therapy with weekly carboplatin. He tolerated therapy well except for constipation. The plan is to continue the same dose schedule on a weekly basis until his radiation is complete. I anticipate he will require 6-7 cycles of therapy.  2. Constipation: I have encouraged him to take stool softeners on a daily basis and uses laxatives such as magnesium citrate if he does not have a bowel movement for 2 days.  3. Renal insufficiency: His creatinine continues to be at baseline around 1.4.  4. Antiemetics: Prescription for Compazine have been off and in the past and he declined. No nausea or vomiting issues.  5. Follow-up: Will be in one week for cycle 4 of carboplatin.   Zola Button, MD 6/1/201712:44 PM

## 2015-08-01 NOTE — Patient Instructions (Signed)
[facility] Discharge Instructions for Patients Receiving Chemotherapy  Today you received the following chemotherapy agents Carboplatin  To help prevent nausea and vomiting after your treatment, we encourage you to take your nausea medication   If you develop nausea and vomiting that is not controlled by your nausea medication, call the clinic.   BELOW ARE SYMPTOMS THAT SHOULD BE REPORTED IMMEDIATELY:  *FEVER GREATER THAN 100.5 F  *CHILLS WITH OR WITHOUT FEVER  NAUSEA AND VOMITING THAT IS NOT CONTROLLED WITH YOUR NAUSEA MEDICATION  *UNUSUAL SHORTNESS OF BREATH  *UNUSUAL BRUISING OR BLEEDING  TENDERNESS IN MOUTH AND THROAT WITH OR WITHOUT PRESENCE OF ULCERS  *URINARY PROBLEMS  *BOWEL PROBLEMS  UNUSUAL RASH Items with * indicate a potential emergency and should be followed up as soon as possible.  Feel free to call the clinic you have any questions or concerns. The clinic phone number is [phone].  Please show the CHEMO ALERT CARD at check-in to the Emergency Department and triage nurse.   

## 2015-08-01 NOTE — Telephone Encounter (Signed)
Per staff message and POF I have scheduled appts. Advised scheduler of appts. JMW  

## 2015-08-02 ENCOUNTER — Ambulatory Visit
Admission: RE | Admit: 2015-08-02 | Discharge: 2015-08-02 | Disposition: A | Discharge status: Home / Self Care | Payer: Medicare Other | Source: Ambulatory Visit | Attending: Radiation Oncology | Admitting: Radiation Oncology

## 2015-08-02 ENCOUNTER — Encounter: Payer: Self-pay | Admitting: Radiation Oncology

## 2015-08-02 ENCOUNTER — Encounter: Payer: Self-pay | Admitting: Oncology

## 2015-08-02 VITALS — BP 111/51 | HR 83 | Temp 97.7°F | Resp 16 | Wt 174.9 lb

## 2015-08-02 DIAGNOSIS — Z510 Encounter for antineoplastic radiation therapy: Secondary | ICD-10-CM | POA: Diagnosis not present

## 2015-08-02 DIAGNOSIS — C672 Malignant neoplasm of lateral wall of bladder: Secondary | ICD-10-CM

## 2015-08-02 NOTE — Progress Notes (Signed)
Pt is approved for the $400 [facility] grant.  °

## 2015-08-02 NOTE — Progress Notes (Signed)
Weight and vitals stable. Denies pain. Denies dysuria. Denies hematuria. Denies any difficulty emptying his bladder. Reports nocturia. Denies any bowel complaints today. Denies fatigue.   BP 111/51 mmHg  Pulse 83  Temp(Src) 97.7 F (36.5 C) (Oral)  Resp 16  Wt 174 lb 14.4 oz (79.334 kg)  SpO2 100% Wt Readings from Last 3 Encounters:  08/02/15 174 lb 14.4 oz (79.334 kg)  08/01/15 172 lb 6.4 oz (78.2 kg)  07/26/15 173 lb 9.6 oz (78.744 kg)

## 2015-08-02 NOTE — Progress Notes (Signed)
  Radiation Oncology         [phone]   Name: Fred Newton MRN: [id number]   Date: 08/02/2015  DOB: 1928-11-03     Weekly Radiation Therapy Management    ICD-9-CM ICD-10-CM   1. Malignant neoplasm of lateral wall of urinary bladder (HCC) 188.2 C67.2     Current Dose: 25.2 Gy  Planned Dose:  64.8 Gy  Narrative The patient presents for routine under treatment assessment.   Weight and vitals stable. Denies pain. Denies dysuria. Denies hematuria. Denies any difficulty emptying his bladder. Reports nocturia. Denies any bowel complaints today. Denies fatigue. He is drinking boost.   The patient is without complaint. Set-up films were reviewed. The chart was checked.  Physical Findings  weight is 174 lb 14.4 oz (79.334 kg). His oral temperature is 97.7 F (36.5 C). His blood pressure is 111/51 and his pulse is 83. His respiration is 16 and oxygen saturation is 100%. . Weight essentially stable.  No significant changes.  Impression The patient is tolerating radiation. We discussed him using supplemental oxygen if needed during his visit at the cancer center.   Plan Continue treatment as planned.       Sheral Apley Tammi Klippel, M.D.    This document serves as a record of services personally performed by Tyler Pita, MD. It was created on his behalf by Lendon Collar, a trained medical scribe. The creation of this record is based on the scribe's personal observations and the provider's statements to them. This document has been checked and approved by the attending provider.

## 2015-08-05 ENCOUNTER — Ambulatory Visit
Admission: RE | Admit: 2015-08-05 | Discharge: 2015-08-05 | Disposition: A | Discharge status: Home / Self Care | Payer: Medicare Other | Source: Ambulatory Visit | Attending: Radiation Oncology | Admitting: Radiation Oncology

## 2015-08-05 DIAGNOSIS — Z510 Encounter for antineoplastic radiation therapy: Secondary | ICD-10-CM | POA: Diagnosis not present

## 2015-08-06 ENCOUNTER — Ambulatory Visit
Admission: RE | Admit: 2015-08-06 | Discharge: 2015-08-06 | Disposition: A | Discharge status: Home / Self Care | Payer: Medicare Other | Source: Ambulatory Visit | Attending: Radiation Oncology | Admitting: Radiation Oncology

## 2015-08-06 DIAGNOSIS — Z510 Encounter for antineoplastic radiation therapy: Secondary | ICD-10-CM | POA: Diagnosis not present

## 2015-08-07 ENCOUNTER — Ambulatory Visit
Admission: RE | Admit: 2015-08-07 | Discharge: 2015-08-07 | Disposition: A | Discharge status: Home / Self Care | Payer: Medicare Other | Source: Ambulatory Visit | Attending: Radiation Oncology | Admitting: Radiation Oncology

## 2015-08-07 DIAGNOSIS — Z510 Encounter for antineoplastic radiation therapy: Secondary | ICD-10-CM | POA: Diagnosis not present

## 2015-08-08 ENCOUNTER — Ambulatory Visit
Admission: RE | Admit: 2015-08-08 | Discharge: 2015-08-08 | Disposition: A | Discharge status: Home / Self Care | Payer: Medicare Other | Source: Ambulatory Visit | Attending: Radiation Oncology | Admitting: Radiation Oncology

## 2015-08-08 ENCOUNTER — Other Ambulatory Visit (HOSPITAL_BASED_OUTPATIENT_CLINIC_OR_DEPARTMENT_OTHER): Payer: Medicare Other

## 2015-08-08 ENCOUNTER — Ambulatory Visit (HOSPITAL_BASED_OUTPATIENT_CLINIC_OR_DEPARTMENT_OTHER): Payer: Medicare Other

## 2015-08-08 VITALS — BP 107/69 | HR 73 | Temp 97.2°F | Resp 18

## 2015-08-08 DIAGNOSIS — Z5111 Encounter for antineoplastic chemotherapy: Secondary | ICD-10-CM | POA: Diagnosis present

## 2015-08-08 DIAGNOSIS — C679 Malignant neoplasm of bladder, unspecified: Secondary | ICD-10-CM | POA: Diagnosis present

## 2015-08-08 DIAGNOSIS — Z510 Encounter for antineoplastic radiation therapy: Secondary | ICD-10-CM | POA: Diagnosis not present

## 2015-08-08 LAB — CBC WITH DIFFERENTIAL/PLATELET
BASO%: 0.2 % (ref 0.0–2.0)
Basophils Absolute: 0 10*3/uL (ref 0.0–0.1)
EOS ABS: 0.2 10*3/uL (ref 0.0–0.5)
EOS%: 5.2 % (ref 0.0–7.0)
HCT: 29.9 % — ABNORMAL LOW (ref 38.4–49.9)
HGB: 10.3 g/dL — ABNORMAL LOW (ref 13.0–17.1)
LYMPH%: 13.9 % — AB (ref 14.0–49.0)
MCH: 32.2 pg (ref 27.2–33.4)
MCHC: 34.4 g/dL (ref 32.0–36.0)
MCV: 93.4 fL (ref 79.3–98.0)
MONO#: 0.5 10*3/uL (ref 0.1–0.9)
MONO%: 10.6 % (ref 0.0–14.0)
NEUT%: 70.1 % (ref 39.0–75.0)
NEUTROS ABS: 3 10*3/uL (ref 1.5–6.5)
PLATELETS: 141 10*3/uL (ref 140–400)
RBC: 3.2 10*6/uL — ABNORMAL LOW (ref 4.20–5.82)
RDW: 13.7 % (ref 11.0–14.6)
WBC: 4.2 10*3/uL (ref 4.0–10.3)
lymph#: 0.6 10*3/uL — ABNORMAL LOW (ref 0.9–3.3)

## 2015-08-08 LAB — COMPREHENSIVE METABOLIC PANEL
ALBUMIN: 3.6 g/dL (ref 3.5–5.0)
ALK PHOS: 42 U/L (ref 40–150)
ALT: 15 U/L (ref 0–55)
ANION GAP: 7 meq/L (ref 3–11)
AST: 22 U/L (ref 5–34)
BUN: 25.1 mg/dL (ref 7.0–26.0)
CALCIUM: 9 mg/dL (ref 8.4–10.4)
CO2: 27 mEq/L (ref 22–29)
Chloride: 105 mEq/L (ref 98–109)
Creatinine: 1.5 mg/dL — ABNORMAL HIGH (ref 0.7–1.3)
EGFR: 42 mL/min/{1.73_m2} — ABNORMAL LOW (ref >90)
Glucose: 88 mg/dl (ref 70–140)
Potassium: 4.2 mEq/L (ref 3.5–5.1)
Sodium: 138 mEq/L (ref 136–145)
Total Bilirubin: 0.55 mg/dL (ref 0.20–1.20)
Total Protein: 6.8 g/dL (ref 6.4–8.3)

## 2015-08-08 MED ORDER — PALONOSETRON HCL INJECTION 0.25 MG/5ML
INTRAVENOUS | Status: AC
  Filled 2015-08-08: qty 5

## 2015-08-08 MED ORDER — SODIUM CHLORIDE 0.9 % IV SOLN
10.0000 mg | Freq: Once | INTRAVENOUS | Status: AC
  Administered 2015-08-08: 10 mg via INTRAVENOUS
  Filled 2015-08-08: qty 1

## 2015-08-08 MED ORDER — SODIUM CHLORIDE 0.9 % IV SOLN
132.2000 mg | Freq: Once | INTRAVENOUS | Status: AC
  Administered 2015-08-08: 130 mg via INTRAVENOUS
  Filled 2015-08-08: qty 13

## 2015-08-08 MED ORDER — SODIUM CHLORIDE 0.9 % IV SOLN
Freq: Once | INTRAVENOUS | Status: AC
  Administered 2015-08-08: 13:00:00 via INTRAVENOUS

## 2015-08-08 MED ORDER — PALONOSETRON HCL INJECTION 0.25 MG/5ML
0.2500 mg | Freq: Once | INTRAVENOUS | Status: AC
  Administered 2015-08-08: 0.25 mg via INTRAVENOUS

## 2015-08-09 ENCOUNTER — Encounter: Payer: Self-pay | Admitting: Radiation Oncology

## 2015-08-09 ENCOUNTER — Ambulatory Visit
Admission: RE | Admit: 2015-08-09 | Discharge: 2015-08-09 | Disposition: A | Discharge status: Home / Self Care | Payer: Medicare Other | Source: Ambulatory Visit | Attending: Radiation Oncology | Admitting: Radiation Oncology

## 2015-08-09 VITALS — BP 122/69 | HR 93 | Resp 16 | Wt 171.9 lb

## 2015-08-09 DIAGNOSIS — Z510 Encounter for antineoplastic radiation therapy: Secondary | ICD-10-CM | POA: Diagnosis not present

## 2015-08-09 DIAGNOSIS — C672 Malignant neoplasm of lateral wall of bladder: Secondary | ICD-10-CM

## 2015-08-09 NOTE — Progress Notes (Signed)
Weight and vitals stable. Denies pain. Denies dysuria. Denies hematuria. Denies any difficulty emptying his bladder. Reports nocturia x 1-2. Reports diarrhea. Encouraged patient to continue Miralax but, stop Senokot. Denies fatigue.   BP 122/69 mmHg  Pulse 93  Resp 16  Wt 171 lb 14.4 oz (77.973 kg)  SpO2 100% Wt Readings from Last 3 Encounters:  08/09/15 171 lb 14.4 oz (77.973 kg)  08/02/15 174 lb 14.4 oz (79.334 kg)  08/01/15 172 lb 6.4 oz (78.2 kg)

## 2015-08-09 NOTE — Progress Notes (Signed)
  Radiation Oncology         [phone]   Name: Fred Newton MRN: [id number]   Date: 08/09/2015  DOB: 1928/04/03     Weekly Radiation Therapy Management    ICD-9-CM ICD-10-CM   1. Malignant neoplasm of lateral wall of urinary bladder (HCC) 188.2 C67.2     Current Dose: 34.2 Gy  Planned Dose:  64.8 Gy  Narrative The patient presents for routine under treatment assessment.   Weight and vitals stable. Denies pain. Denies dysuria. Denies hematuria. Denies any difficulty emptying his bladder. Reports nocturia x 1-2. Reports diarrhea. Encouraged patient to continue Miralax but, stop Senokot. Denies fatigue.   The patient is without complaint. Set-up films were reviewed. The chart was checked.  Physical Findings  weight is 171 lb 14.4 oz (77.973 kg). His blood pressure is 122/69 and his pulse is 93. His respiration is 16 and oxygen saturation is 100%. . Weight essentially stable.  No significant changes.  Impression The patient is tolerating radiation.   Plan Continue treatment as planned.       Sheral Apley Tammi Klippel, M.D.   This document serves as a record of services personally performed by Tyler Pita, MD. It was created on his behalf by Derek Mound, a trained medical scribe. The creation of this record is based on the scribe's personal observations and the provider's statements to them. This document has been checked and approved by the attending provider.

## 2015-08-12 ENCOUNTER — Ambulatory Visit
Admission: RE | Admit: 2015-08-12 | Discharge: 2015-08-12 | Disposition: A | Discharge status: Home / Self Care | Payer: Medicare Other | Source: Ambulatory Visit | Attending: Radiation Oncology | Admitting: Radiation Oncology

## 2015-08-12 DIAGNOSIS — Z510 Encounter for antineoplastic radiation therapy: Secondary | ICD-10-CM | POA: Diagnosis not present

## 2015-08-13 ENCOUNTER — Ambulatory Visit
Admission: RE | Admit: 2015-08-13 | Discharge: 2015-08-13 | Disposition: A | Discharge status: Home / Self Care | Payer: Medicare Other | Source: Ambulatory Visit | Attending: Radiation Oncology | Admitting: Radiation Oncology

## 2015-08-13 DIAGNOSIS — Z510 Encounter for antineoplastic radiation therapy: Secondary | ICD-10-CM | POA: Diagnosis not present

## 2015-08-14 ENCOUNTER — Ambulatory Visit
Admission: RE | Admit: 2015-08-14 | Discharge: 2015-08-14 | Disposition: A | Discharge status: Home / Self Care | Payer: Medicare Other | Source: Ambulatory Visit | Attending: Radiation Oncology | Admitting: Radiation Oncology

## 2015-08-14 DIAGNOSIS — Z510 Encounter for antineoplastic radiation therapy: Secondary | ICD-10-CM | POA: Diagnosis not present

## 2015-08-15 ENCOUNTER — Ambulatory Visit
Admission: RE | Admit: 2015-08-15 | Discharge: 2015-08-15 | Disposition: A | Discharge status: Home / Self Care | Payer: Medicare Other | Source: Ambulatory Visit | Attending: Radiation Oncology | Admitting: Radiation Oncology

## 2015-08-15 ENCOUNTER — Ambulatory Visit (HOSPITAL_BASED_OUTPATIENT_CLINIC_OR_DEPARTMENT_OTHER): Payer: Medicare Other

## 2015-08-15 ENCOUNTER — Other Ambulatory Visit (HOSPITAL_BASED_OUTPATIENT_CLINIC_OR_DEPARTMENT_OTHER): Payer: Medicare Other

## 2015-08-15 VITALS — BP 98/60 | HR 73 | Temp 97.7°F | Resp 20

## 2015-08-15 DIAGNOSIS — C679 Malignant neoplasm of bladder, unspecified: Secondary | ICD-10-CM

## 2015-08-15 DIAGNOSIS — Z510 Encounter for antineoplastic radiation therapy: Secondary | ICD-10-CM | POA: Diagnosis not present

## 2015-08-15 DIAGNOSIS — Z5111 Encounter for antineoplastic chemotherapy: Secondary | ICD-10-CM | POA: Diagnosis present

## 2015-08-15 LAB — CBC WITH DIFFERENTIAL/PLATELET
BASO%: 0.3 % (ref 0.0–2.0)
BASOS ABS: 0 10*3/uL (ref 0.0–0.1)
EOS ABS: 0.1 10*3/uL (ref 0.0–0.5)
EOS%: 3.6 % (ref 0.0–7.0)
HEMATOCRIT: 29.6 % — AB (ref 38.4–49.9)
HEMOGLOBIN: 10.2 g/dL — AB (ref 13.0–17.1)
LYMPH#: 0.6 10*3/uL — AB (ref 0.9–3.3)
LYMPH%: 15 % (ref 14.0–49.0)
MCH: 32.4 pg (ref 27.2–33.4)
MCHC: 34.5 g/dL (ref 32.0–36.0)
MCV: 94 fL (ref 79.3–98.0)
MONO#: 0.4 10*3/uL (ref 0.1–0.9)
MONO%: 11.4 % (ref 0.0–14.0)
NEUT#: 2.7 10*3/uL (ref 1.5–6.5)
NEUT%: 69.7 % (ref 39.0–75.0)
PLATELETS: 122 10*3/uL — AB (ref 140–400)
RBC: 3.15 10*6/uL — AB (ref 4.20–5.82)
RDW: 14.1 % (ref 11.0–14.6)
WBC: 3.9 10*3/uL — ABNORMAL LOW (ref 4.0–10.3)

## 2015-08-15 LAB — COMPREHENSIVE METABOLIC PANEL
ALT: 15 U/L (ref 0–55)
ANION GAP: 7 meq/L (ref 3–11)
AST: 19 U/L (ref 5–34)
Albumin: 3.5 g/dL (ref 3.5–5.0)
Alkaline Phosphatase: 38 U/L — ABNORMAL LOW (ref 40–150)
BUN: 24.4 mg/dL (ref 7.0–26.0)
CHLORIDE: 106 meq/L (ref 98–109)
CO2: 26 mEq/L (ref 22–29)
CREATININE: 1.2 mg/dL (ref 0.7–1.3)
Calcium: 9.2 mg/dL (ref 8.4–10.4)
EGFR: 53 mL/min/{1.73_m2} — ABNORMAL LOW (ref >90)
GLUCOSE: 93 mg/dL (ref 70–140)
Potassium: 4.2 mEq/L (ref 3.5–5.1)
SODIUM: 139 meq/L (ref 136–145)
TOTAL PROTEIN: 6.6 g/dL (ref 6.4–8.3)
Total Bilirubin: 0.63 mg/dL (ref 0.20–1.20)

## 2015-08-15 MED ORDER — SODIUM CHLORIDE 0.9 % IV SOLN
152.8000 mg | Freq: Once | INTRAVENOUS | Status: AC
  Administered 2015-08-15: 150 mg via INTRAVENOUS
  Filled 2015-08-15: qty 15

## 2015-08-15 MED ORDER — PALONOSETRON HCL INJECTION 0.25 MG/5ML
0.2500 mg | Freq: Once | INTRAVENOUS | Status: AC
  Administered 2015-08-15: 0.25 mg via INTRAVENOUS

## 2015-08-15 MED ORDER — PALONOSETRON HCL INJECTION 0.25 MG/5ML
INTRAVENOUS | Status: AC
  Filled 2015-08-15: qty 5

## 2015-08-15 MED ORDER — SODIUM CHLORIDE 0.9 % IV SOLN
Freq: Once | INTRAVENOUS | Status: AC
  Administered 2015-08-15: 13:00:00 via INTRAVENOUS

## 2015-08-15 MED ORDER — SODIUM CHLORIDE 0.9 % IV SOLN
10.0000 mg | Freq: Once | INTRAVENOUS | Status: AC
  Administered 2015-08-15: 10 mg via INTRAVENOUS
  Filled 2015-08-15: qty 1

## 2015-08-15 NOTE — Patient Instructions (Signed)
[facility] Discharge Instructions for Patients Receiving Chemotherapy  Today you received the following chemotherapy agents:  Carboplatin  To help prevent nausea and vomiting after your treatment, we encourage you to take your nausea medication as ordered per MD.   If you develop nausea and vomiting that is not controlled by your nausea medication, call the clinic.   BELOW ARE SYMPTOMS THAT SHOULD BE REPORTED IMMEDIATELY:  *FEVER GREATER THAN 100.5 F  *CHILLS WITH OR WITHOUT FEVER  NAUSEA AND VOMITING THAT IS NOT CONTROLLED WITH YOUR NAUSEA MEDICATION  *UNUSUAL SHORTNESS OF BREATH  *UNUSUAL BRUISING OR BLEEDING  TENDERNESS IN MOUTH AND THROAT WITH OR WITHOUT PRESENCE OF ULCERS  *URINARY PROBLEMS  *BOWEL PROBLEMS  UNUSUAL RASH Items with * indicate a potential emergency and should be followed up as soon as possible.  Feel free to call the clinic you have any questions or concerns. The clinic phone number is [phone].  Please show the CHEMO ALERT CARD at check-in to the Emergency Department and triage nurse.   

## 2015-08-16 ENCOUNTER — Encounter: Payer: Self-pay | Admitting: Radiation Oncology

## 2015-08-16 ENCOUNTER — Ambulatory Visit
Admission: RE | Admit: 2015-08-16 | Discharge: 2015-08-16 | Disposition: A | Discharge status: Home / Self Care | Payer: Medicare Other | Source: Ambulatory Visit | Attending: Radiation Oncology | Admitting: Radiation Oncology

## 2015-08-16 VITALS — BP 114/70 | HR 96 | Resp 16 | Wt 172.2 lb

## 2015-08-16 DIAGNOSIS — C672 Malignant neoplasm of lateral wall of bladder: Secondary | ICD-10-CM

## 2015-08-16 DIAGNOSIS — Z510 Encounter for antineoplastic radiation therapy: Secondary | ICD-10-CM | POA: Diagnosis not present

## 2015-08-16 NOTE — Progress Notes (Signed)
Weight and vitals stable. Denies pain. Denies dysuria or hematuria. Denies difficulty emptying his bladder. Reports nocturia x 2. Denies diarrhea. Reports mild fatigue.   BP 114/70 mmHg  Pulse 96  Resp 16  Wt 172 lb 3.2 oz (78.109 kg)  SpO2 100% Wt Readings from Last 3 Encounters:  08/16/15 172 lb 3.2 oz (78.109 kg)  08/09/15 171 lb 14.4 oz (77.973 kg)  08/02/15 174 lb 14.4 oz (79.334 kg)

## 2015-08-16 NOTE — Progress Notes (Signed)
Department of Radiation Oncology  Phone:  [phone] Fax:        [phone]  Weekly Treatment Note    Name: Fred Newton Date: 08/17/2015 MRN: [id number] DOB: 09/01/1928   Diagnosis:     ICD-9-CM ICD-10-CM   1. Malignant neoplasm of lateral wall of urinary bladder (HCC) 188.2 C67.2      Current dose: 41.4 Gy  Current fraction:23   MEDICATIONS: Current Outpatient Prescriptions  Medication Sig Dispense Refill  . albuterol (PROVENTIL HFA;VENTOLIN HFA) 108 (90 BASE) MCG/ACT inhaler Inhale 2 puffs into the lungs every 6 (six) hours as needed for wheezing or shortness of breath.    Marland Kitchen aspirin EC 81 MG tablet Take 81 mg by mouth every morning.     . Calcium Carbonate-Vitamin D (CALCIUM 600+D) 600-400 MG-UNIT tablet Take 1 tablet by mouth daily.    . camphor-menthol (SARNA) lotion Apply 1 application topically 2 (two) times daily as needed for itching.    . cetirizine (ZYRTEC) 10 MG tablet Take 10 mg by mouth every morning.     . cycloSPORINE (RESTASIS) 0.05 % ophthalmic emulsion Place 1 drop into both eyes 2 (two) times daily as needed (Dry eyes).     Marland Kitchen esomeprazole (NEXIUM) 40 MG capsule Take 40 mg by mouth every evening.     . fluticasone (FLONASE) 50 MCG/ACT nasal spray Place 1 spray into both nostrils daily as needed for allergies or rhinitis.    . Fluticasone-Salmeterol (ADVAIR) 250-50 MCG/DOSE AEPB Inhale 1 puff into the lungs 2 (two) times daily.    Marland Kitchen ketoconazole (NIZORAL) 2 % cream Apply 1 application topically 2 (two) times daily.     . Multiple Vitamin (MULTIVITAMIN WITH MINERALS) TABS tablet Take 1 tablet by mouth every morning.     . Omega-3 Fatty Acids (FISH OIL) 1000 MG CAPS Take 1,000 mg by mouth 2 (two) times daily.    . OXYGEN Inhale into the lungs. 2 LITERS AT NIGHT    . Psyllium (METAMUCIL) WAFR Take 2 Wafers by mouth daily.     . simvastatin (ZOCOR) 40 MG tablet Take 40 mg by mouth every evening.     . tamsulosin (FLOMAX) 0.4 MG CAPS capsule Take 0.4 mg  by mouth every evening.     . traMADol (ULTRAM) 50 MG tablet Take 1 tablet (50 mg total) by mouth every 6 (six) hours as needed for moderate pain. 20 tablet 0  . triamcinolone cream (KENALOG) 0.1 % Apply 1 application topically daily as needed (Ithcing).      No current facility-administered medications for this encounter.   Facility-Administered Medications Ordered in Other Encounters  Medication Dose Route Frequency Provider Last Rate Last Dose  . epirubicin (ELLENCE) 50 mg in sodium chloride 0.9 % bladder instillation  50 mg Bladder Instillation Once Festus Aloe, MD         ALLERGIES: Hibiclens   LABORATORY DATA:  Lab Results  Component Value Date   WBC 3.9* 08/15/2015   HGB 10.2* 08/15/2015   HCT 29.6* 08/15/2015   MCV 94.0 08/15/2015   PLT 122* 08/15/2015   Lab Results  Component Value Date   NA 139 08/15/2015   K 4.2 08/15/2015   CL 106 05/27/2015   CO2 26 08/15/2015   Lab Results  Component Value Date   ALT 15 08/15/2015   AST 19 08/15/2015   ALKPHOS 38* 08/15/2015   BILITOT 0.63 08/15/2015     NARRATIVE: Fred Newton was seen today for weekly treatment management. The  chart was checked and the patient's films were reviewed.  Denies pain, dysuria, hematuria, difficulty emptying his bladder, or diarrhea. His only complaints are nocturia x 2 and mild fatigue.   PHYSICAL EXAMINATION: weight is 172 lb 3.2 oz (78.109 kg). His blood pressure is 114/70 and his pulse is 96. His respiration is 16 and oxygen saturation is 100%.   ASSESSMENT: The patient is doing satisfactorily with treatment.  PLAN: We will continue with the patient's radiation treatment as planned.  ------------------------------------------------  Jodelle Gross, MD, PhD  This document serves as a record of services personally performed by Kyung Rudd, MD. It was created on his behalf by Darcus Austin, a trained medical scribe. The creation of this record is based on the scribe's personal  observations and the provider's statements to them. This document has been checked and approved by the attending provider.

## 2015-08-19 ENCOUNTER — Ambulatory Visit
Admission: RE | Admit: 2015-08-19 | Discharge: 2015-08-19 | Disposition: A | Discharge status: Home / Self Care | Payer: Medicare Other | Source: Ambulatory Visit | Attending: Radiation Oncology | Admitting: Radiation Oncology

## 2015-08-19 DIAGNOSIS — Z510 Encounter for antineoplastic radiation therapy: Secondary | ICD-10-CM | POA: Diagnosis not present

## 2015-08-20 ENCOUNTER — Ambulatory Visit
Admission: RE | Admit: 2015-08-20 | Discharge: 2015-08-20 | Disposition: A | Discharge status: Home / Self Care | Payer: Medicare Other | Source: Ambulatory Visit | Attending: Radiation Oncology | Admitting: Radiation Oncology

## 2015-08-20 DIAGNOSIS — Z510 Encounter for antineoplastic radiation therapy: Secondary | ICD-10-CM | POA: Diagnosis not present

## 2015-08-21 ENCOUNTER — Ambulatory Visit
Admission: RE | Admit: 2015-08-21 | Discharge: 2015-08-21 | Disposition: A | Discharge status: Home / Self Care | Payer: Medicare Other | Source: Ambulatory Visit | Attending: Radiation Oncology | Admitting: Radiation Oncology

## 2015-08-21 DIAGNOSIS — Z510 Encounter for antineoplastic radiation therapy: Secondary | ICD-10-CM | POA: Diagnosis not present

## 2015-08-22 ENCOUNTER — Other Ambulatory Visit (HOSPITAL_BASED_OUTPATIENT_CLINIC_OR_DEPARTMENT_OTHER): Payer: Medicare Other

## 2015-08-22 ENCOUNTER — Ambulatory Visit (HOSPITAL_BASED_OUTPATIENT_CLINIC_OR_DEPARTMENT_OTHER): Payer: Medicare Other | Admitting: Oncology

## 2015-08-22 ENCOUNTER — Telehealth: Payer: Self-pay | Admitting: Oncology

## 2015-08-22 ENCOUNTER — Ambulatory Visit
Admission: RE | Admit: 2015-08-22 | Discharge: 2015-08-22 | Disposition: A | Discharge status: Home / Self Care | Payer: Medicare Other | Source: Ambulatory Visit | Attending: Radiation Oncology | Admitting: Radiation Oncology

## 2015-08-22 ENCOUNTER — Ambulatory Visit (HOSPITAL_BASED_OUTPATIENT_CLINIC_OR_DEPARTMENT_OTHER): Payer: Medicare Other

## 2015-08-22 VITALS — BP 103/58 | HR 91 | Temp 98.1°F | Resp 16 | Ht 72.0 in | Wt 170.8 lb

## 2015-08-22 DIAGNOSIS — Z5111 Encounter for antineoplastic chemotherapy: Secondary | ICD-10-CM

## 2015-08-22 DIAGNOSIS — J439 Emphysema, unspecified: Secondary | ICD-10-CM

## 2015-08-22 DIAGNOSIS — C672 Malignant neoplasm of lateral wall of bladder: Secondary | ICD-10-CM

## 2015-08-22 DIAGNOSIS — C679 Malignant neoplasm of bladder, unspecified: Secondary | ICD-10-CM

## 2015-08-22 DIAGNOSIS — N289 Disorder of kidney and ureter, unspecified: Secondary | ICD-10-CM

## 2015-08-22 DIAGNOSIS — Z510 Encounter for antineoplastic radiation therapy: Secondary | ICD-10-CM | POA: Diagnosis not present

## 2015-08-22 DIAGNOSIS — D6959 Other secondary thrombocytopenia: Secondary | ICD-10-CM | POA: Diagnosis not present

## 2015-08-22 LAB — CBC WITH DIFFERENTIAL/PLATELET
BASO%: 0.5 % (ref 0.0–2.0)
Basophils Absolute: 0 10*3/uL (ref 0.0–0.1)
EOS%: 3.5 % (ref 0.0–7.0)
Eosinophils Absolute: 0.1 10*3/uL (ref 0.0–0.5)
HCT: 30.3 % — ABNORMAL LOW (ref 38.4–49.9)
HGB: 10.2 g/dL — ABNORMAL LOW (ref 13.0–17.1)
LYMPH%: 17.9 % (ref 14.0–49.0)
MCH: 32.6 pg (ref 27.2–33.4)
MCHC: 33.9 g/dL (ref 32.0–36.0)
MCV: 96.4 fL (ref 79.3–98.0)
MONO#: 0.4 10*3/uL (ref 0.1–0.9)
MONO%: 10.4 % (ref 0.0–14.0)
NEUT#: 2.6 10*3/uL (ref 1.5–6.5)
NEUT%: 67.7 % (ref 39.0–75.0)
PLATELETS: 102 10*3/uL — AB (ref 140–400)
RBC: 3.14 10*6/uL — AB (ref 4.20–5.82)
RDW: 14.5 % (ref 11.0–14.6)
WBC: 3.8 10*3/uL — ABNORMAL LOW (ref 4.0–10.3)
lymph#: 0.7 10*3/uL — ABNORMAL LOW (ref 0.9–3.3)

## 2015-08-22 LAB — COMPREHENSIVE METABOLIC PANEL
ALT: 16 U/L (ref 0–55)
ANION GAP: 7 meq/L (ref 3–11)
AST: 20 U/L (ref 5–34)
Albumin: 3.5 g/dL (ref 3.5–5.0)
Alkaline Phosphatase: 43 U/L (ref 40–150)
BUN: 30.2 mg/dL — AB (ref 7.0–26.0)
CHLORIDE: 106 meq/L (ref 98–109)
CO2: 25 mEq/L (ref 22–29)
Calcium: 8.9 mg/dL (ref 8.4–10.4)
Creatinine: 1.3 mg/dL (ref 0.7–1.3)
EGFR: 49 mL/min/{1.73_m2} — AB (ref >90)
GLUCOSE: 95 mg/dL (ref 70–140)
Potassium: 4.3 mEq/L (ref 3.5–5.1)
SODIUM: 138 meq/L (ref 136–145)
Total Bilirubin: 0.47 mg/dL (ref 0.20–1.20)
Total Protein: 6.5 g/dL (ref 6.4–8.3)

## 2015-08-22 MED ORDER — SODIUM CHLORIDE 0.9 % IV SOLN
10.0000 mg | Freq: Once | INTRAVENOUS | Status: AC
  Administered 2015-08-22: 10 mg via INTRAVENOUS
  Filled 2015-08-22: qty 1

## 2015-08-22 MED ORDER — PALONOSETRON HCL INJECTION 0.25 MG/5ML
INTRAVENOUS | Status: AC
  Filled 2015-08-22: qty 5

## 2015-08-22 MED ORDER — CARBOPLATIN CHEMO INJECTION 450 MG/45ML
144.8000 mg | Freq: Once | INTRAVENOUS | Status: AC
  Administered 2015-08-22: 140 mg via INTRAVENOUS
  Filled 2015-08-22: qty 14

## 2015-08-22 MED ORDER — PALONOSETRON HCL INJECTION 0.25 MG/5ML
0.2500 mg | Freq: Once | INTRAVENOUS | Status: AC
  Administered 2015-08-22: 0.25 mg via INTRAVENOUS

## 2015-08-22 MED ORDER — SODIUM CHLORIDE 0.9 % IV SOLN
Freq: Once | INTRAVENOUS | Status: AC
  Administered 2015-08-22: 14:00:00 via INTRAVENOUS

## 2015-08-22 NOTE — Telephone Encounter (Signed)
per pof to sch pt appt-gave pt copy of avs °

## 2015-08-22 NOTE — Patient Instructions (Signed)
[facility] Discharge Instructions for Patients Receiving Chemotherapy  Today you received the following chemotherapy agents:  Carboplatin  To help prevent nausea and vomiting after your treatment, we encourage you to take your nausea medication as ordered per MD.   If you develop nausea and vomiting that is not controlled by your nausea medication, call the clinic.   BELOW ARE SYMPTOMS THAT SHOULD BE REPORTED IMMEDIATELY:  *FEVER GREATER THAN 100.5 F  *CHILLS WITH OR WITHOUT FEVER  NAUSEA AND VOMITING THAT IS NOT CONTROLLED WITH YOUR NAUSEA MEDICATION  *UNUSUAL SHORTNESS OF BREATH  *UNUSUAL BRUISING OR BLEEDING  TENDERNESS IN MOUTH AND THROAT WITH OR WITHOUT PRESENCE OF ULCERS  *URINARY PROBLEMS  *BOWEL PROBLEMS  UNUSUAL RASH Items with * indicate a potential emergency and should be followed up as soon as possible.  Feel free to call the clinic you have any questions or concerns. The clinic phone number is [phone].  Please show the CHEMO ALERT CARD at check-in to the Emergency Department and triage nurse.   

## 2015-08-22 NOTE — Progress Notes (Signed)
Hematology and Oncology Follow Up Visit  Fred Newton [id number] 1928-08-17 82 y.o. 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    Prior Therapy: On 05/28/2015 he underwent TURBT and the pathology showed high-grade invasive urothelial carcinoma with invasion into the muscularis propria. CT scan of the abdomen and pelvis on 05/27/2015 showed a dominant enhancing lesion in the right posterior lateral aspect of the bladder wall but no evidence of any adenopathy  Current therapy: Definitive radiation therapy with weekly carboplatin started on 07/18/2015. He is here for week 6 of carboplatin.  Interim History: Fred Newton presents today for a follow-up visit. Since the last visit, he has tolerated therapy reasonably well. He denied any nausea or vomiting but did report constipation and diarrhea at times. His bowel habits have been regular however in the last week. He does not report any other complications such as tiredness or neuropathy. He continues to perform activities of daily living without any decline. He does report progressive fatigue however. He still stays in [place] during the week and travels to his home over the weekend. He denied any hospitalization or illnesses.  He is not report any headaches, blurry vision, syncope or seizures. He does not report any fevers, chills or sweats. He does not report any cough, wheezing or hemoptysis. Does not report any nausea, vomiting, abdominal pain, hematochezia or melena. He does not report any frequency, urgency or hesitancy. Does not report any skeletal complaints. Remaining review of systems unremarkable.  Medications: I have reviewed the patient's current medications.  Current Outpatient Prescriptions  Medication Sig Dispense Refill  . albuterol (PROVENTIL HFA;VENTOLIN HFA) 108  (90 BASE) MCG/ACT inhaler Inhale 2 puffs into the lungs every 6 (six) hours as needed for wheezing or shortness of breath.    Marland Kitchen aspirin EC 81 MG tablet Take 81 mg by mouth every morning.     . Calcium Carbonate-Vitamin D (CALCIUM 600+D) 600-400 MG-UNIT tablet Take 1 tablet by mouth daily.    . camphor-menthol (SARNA) lotion Apply 1 application topically 2 (two) times daily as needed for itching.    . cetirizine (ZYRTEC) 10 MG tablet Take 10 mg by mouth every morning.     . cycloSPORINE (RESTASIS) 0.05 % ophthalmic emulsion Place 1 drop into both eyes 2 (two) times daily as needed (Dry eyes).     Marland Kitchen esomeprazole (NEXIUM) 40 MG capsule Take 40 mg by mouth every evening.     . fluticasone (FLONASE) 50 MCG/ACT nasal spray Place 1 spray into both nostrils daily as needed for allergies or rhinitis.    . Fluticasone-Salmeterol (ADVAIR) 250-50 MCG/DOSE AEPB Inhale 1 puff into the lungs 2 (two) times daily.    Marland Kitchen ketoconazole (NIZORAL) 2 % cream Apply 1 application topically 2 (two) times daily.     . Multiple Vitamin (MULTIVITAMIN WITH MINERALS) TABS tablet Take 1 tablet by mouth every morning.     . Omega-3 Fatty Acids (FISH OIL) 1000 MG CAPS Take 1,000 mg by mouth 2 (two) times daily.    . OXYGEN Inhale into the lungs. 2 LITERS AT NIGHT    . Psyllium (METAMUCIL) WAFR Take 2 Wafers by mouth daily.     . simvastatin (ZOCOR) 40 MG tablet Take 40 mg by mouth every evening.     . tamsulosin (FLOMAX) 0.4 MG CAPS capsule Take 0.4 mg by mouth every evening.     Marland Kitchen  traMADol (ULTRAM) 50 MG tablet Take 1 tablet (50 mg total) by mouth every 6 (six) hours as needed for moderate pain. 20 tablet 0  . triamcinolone cream (KENALOG) 0.1 % Apply 1 application topically daily as needed (Ithcing).      No current facility-administered medications for this visit.   Facility-Administered Medications Ordered in Other Visits  Medication Dose Route Frequency Provider Last Rate Last Dose  . epirubicin (ELLENCE) 50 mg in sodium  chloride 0.9 % bladder instillation  50 mg Bladder Instillation Once Festus Aloe, MD         Allergies:  Allergies  Allergen Reactions  . Hibiclens [Chlorhexidine] Itching, Rash and Other (See Comments)    Patient stated,"it burns my skin."    Past Medical History, Surgical history, Social history, and Family History were reviewed and updated.  Review of systems: Please see history of present illness.  Physical Exam: Blood pressure 103/58, pulse 91, temperature 98.1 F (36.7 C), temperature source Oral, resp. rate 16, height 6' (1.829 m), weight 170 lb 12.8 oz (77.474 kg), SpO2 97 %. ECOG: 0 General appearance: alert and cooperative appeared without distress. Head: Normocephalic, without obvious abnormality no oral ulcers or lesions. Neck: no adenopathy Lymph nodes: Cervical, supraclavicular, and axillary nodes normal. Heart:regular rate and rhythm, S1, S2 normal, no murmur, click, rub or gallop Lung:chest clear, no wheezing, rales, normal symmetric air entry Abdomin: soft, non-tender, without masses or organomegaly no shifting dullness or ascites. EXT:no erythema, induration, or nodules   Lab Results: Lab Results  Component Value Date   WBC 3.8* 08/22/2015   HGB 10.2* 08/22/2015   HCT 30.3* 08/22/2015   MCV 96.4 08/22/2015   PLT 102* 08/22/2015     Chemistry      Component Value Date/Time   NA 139 08/15/2015 1231   NA 139 05/27/2015 1135   K 4.2 08/15/2015 1231   K 5.2* 05/27/2015 1135   CL 106 05/27/2015 1135   CO2 26 08/15/2015 1231   CO2 25 05/27/2015 1135   BUN 24.4 08/15/2015 1231   BUN 25* 05/27/2015 1135   CREATININE 1.2 08/15/2015 1231   CREATININE 1.50* 05/27/2015 1135      Component Value Date/Time   CALCIUM 9.2 08/15/2015 1231   CALCIUM 9.1 05/27/2015 1135   ALKPHOS 38* 08/15/2015 1231   AST 19 08/15/2015 1231   ALT 15 08/15/2015 1231   BILITOT 0.63 08/15/2015 1231         Impression and Plan:  82 year old gentleman with the  following issues:  1. Transitional cell carcinoma of the bladder presented with muscle invasive disease and a tumor in the right posterior bladder. He is status post TURBT which showed a T2 disease and CT scan showed no evidence of lymphadenopathy or metastatic disease.  He is currently receiving definitive radiation therapy with weekly carboplatin. He tolerated therapy well without major complications. The plan is to proceed with week 6 of carboplatin and suspend any further chemotherapy. He has 1 week left of radiation but he prefers not to receive any further chemotherapy at this time. I'll he is reasonable to suspend therapy and he will finish his radiation therapy first week of July.  Upon completion of therapy, he will return to have a staging CT scan which will be scheduled in September 2017.  2. Constipation: Doing better at this time and bowel habits have been regular.  3. Renal insufficiency: His creatinine continues to be at baseline around 1.2  4. Antiemetics: Prescription for Compazine have been  off and in the past and he declined. No nausea or vomiting issues.  5. Thrombocytopenia: Related to carboplatin. He should be able to tolerate carboplatin last treatment today and no further therapy because of cytopenia at this time.  6. Follow-up: In September for repeat CT scan for staging purposes.   [facility], MD 6/22/201712:27 PM

## 2015-08-23 ENCOUNTER — Ambulatory Visit
Admission: RE | Admit: 2015-08-23 | Discharge: 2015-08-23 | Disposition: A | Discharge status: Home / Self Care | Payer: Medicare Other | Source: Ambulatory Visit | Attending: Radiation Oncology | Admitting: Radiation Oncology

## 2015-08-23 ENCOUNTER — Encounter: Payer: Self-pay | Admitting: Radiation Oncology

## 2015-08-23 VITALS — BP 107/70 | HR 78 | Temp 98.0°F | Resp 16 | Wt 172.2 lb

## 2015-08-23 DIAGNOSIS — C672 Malignant neoplasm of lateral wall of bladder: Secondary | ICD-10-CM

## 2015-08-23 DIAGNOSIS — Z510 Encounter for antineoplastic radiation therapy: Secondary | ICD-10-CM | POA: Diagnosis not present

## 2015-08-23 NOTE — Progress Notes (Signed)
Weight and vitals stable. Denies pain. Denies hematuria or dysuria. Reports nocturia x 1. Denies any bowel complaints. Reports mild fatigue. Received last chemotherapy yesterday.  BP 107/70 mmHg  Pulse 78  Temp(Src) 98 F (36.7 C) (Oral)  Resp 16  Wt 172 lb 3.2 oz (78.109 kg)  SpO2 95% Wt Readings from Last 3 Encounters:  08/23/15 172 lb 3.2 oz (78.109 kg)  08/22/15 170 lb 12.8 oz (77.474 kg)  08/16/15 172 lb 3.2 oz (78.109 kg)

## 2015-08-23 NOTE — Progress Notes (Signed)
  Radiation Oncology         [phone]   Name: Fred Newton MRN: [id number]   Date: 08/23/2015  DOB: 10-15-1928     Weekly Radiation Therapy Management    ICD-9-CM ICD-10-CM   1. Malignant neoplasm of lateral wall of urinary bladder (HCC) 188.2 C67.2     Current Dose: 52.2 Gy  Planned Dose:  64.8 Gy  Narrative The patient presents for routine under treatment assessment.   Denies pain. Denies hematuria or dysuria. Reports nocturia x 1. Denies any bowel complaints. Reports mild fatigue. Received last chemotherapy yesterday.  Set-up films were reviewed. The chart was checked.  Physical Findings  weight is 172 lb 3.2 oz (78.109 kg). His oral temperature is 98 F (36.7 C). His blood pressure is 107/70 and his pulse is 78. His respiration is 16 and oxygen saturation is 95%. . Weight essentially stable.  No significant changes.  Impression The patient is tolerating radiation.   Plan Continue treatment as planned.       Sheral Apley Tammi Klippel, M.D.  This document serves as a record of services personally performed by Tyler Pita, MD. It was created on his behalf by Darcus Austin, a trained medical scribe. The creation of this record is based on the scribe's personal observations and the provider's statements to them. This document has been checked and approved by the attending provider.

## 2015-08-26 ENCOUNTER — Ambulatory Visit
Admission: RE | Admit: 2015-08-26 | Discharge: 2015-08-26 | Disposition: A | Discharge status: Home / Self Care | Payer: Medicare Other | Source: Ambulatory Visit | Attending: Radiation Oncology | Admitting: Radiation Oncology

## 2015-08-26 DIAGNOSIS — Z510 Encounter for antineoplastic radiation therapy: Secondary | ICD-10-CM | POA: Diagnosis not present

## 2015-08-27 ENCOUNTER — Ambulatory Visit
Admission: RE | Admit: 2015-08-27 | Discharge: 2015-08-27 | Disposition: A | Discharge status: Home / Self Care | Payer: Medicare Other | Source: Ambulatory Visit | Attending: Radiation Oncology | Admitting: Radiation Oncology

## 2015-08-27 DIAGNOSIS — Z510 Encounter for antineoplastic radiation therapy: Secondary | ICD-10-CM | POA: Diagnosis not present

## 2015-08-28 ENCOUNTER — Ambulatory Visit
Admission: RE | Admit: 2015-08-28 | Discharge: 2015-08-28 | Disposition: A | Discharge status: Home / Self Care | Payer: Medicare Other | Source: Ambulatory Visit | Attending: Radiation Oncology | Admitting: Radiation Oncology

## 2015-08-28 DIAGNOSIS — Z510 Encounter for antineoplastic radiation therapy: Secondary | ICD-10-CM | POA: Diagnosis not present

## 2015-08-29 ENCOUNTER — Ambulatory Visit
Admission: RE | Admit: 2015-08-29 | Discharge: 2015-08-29 | Disposition: A | Discharge status: Home / Self Care | Payer: Medicare Other | Source: Ambulatory Visit | Attending: Radiation Oncology | Admitting: Radiation Oncology

## 2015-08-29 ENCOUNTER — Encounter: Payer: Self-pay | Admitting: Radiation Oncology

## 2015-08-29 VITALS — BP 100/58 | HR 76 | Resp 16 | Wt 170.6 lb

## 2015-08-29 DIAGNOSIS — Z510 Encounter for antineoplastic radiation therapy: Secondary | ICD-10-CM | POA: Diagnosis not present

## 2015-08-29 DIAGNOSIS — C672 Malignant neoplasm of lateral wall of bladder: Secondary | ICD-10-CM

## 2015-08-29 NOTE — Progress Notes (Signed)
  Radiation Oncology         [phone]   Name: Fred Newton MRN: [id number]   Date: 08/29/2015  DOB: 1929/02/25     Weekly Radiation Therapy Management  Malignant neoplasm of bladder  Current Dose: 54.0 Gy  Planned Dose:  64.8 Gy  Narrative The patient presents for routine under treatment assessment.   Weight and vitals stable. Denies pain. Denies hematuria. Reports mild new onset of dysuria. Reports nocturia x 1. Denies diarrhea. Reports fatigue. One month follow up appointment card given. Patient understands to contact this RN with future needs.  Set-up films were reviewed. The chart was checked.  Physical Findings  weight is 170 lb 9.6 oz (77.384 kg). His blood pressure is 100/58 and his pulse is 76. His respiration is 16 and oxygen saturation is 100%. . Weight essentially stable.  No significant changes.  Impression The patient is tolerating radiation.   Plan Continue treatment as planned.       Sheral Apley Tammi Klippel, M.D.  This document serves as a record of services personally performed by Tyler Pita, MD. It was created on his behalf by Truddie Hidden, a trained medical scribe. The creation of this record is based on the scribe's personal observations and the provider's statements to them. This document has been checked and approved by the attending provider.

## 2015-08-29 NOTE — Progress Notes (Signed)
Weight and vitals stable. Denies pain. Denies hematuria. Reports mild new onset of dysuria. Reports nocturia x 1. Denies diarrhea. Reports fatigue. One month follow up appointment card given. Patient understands to contact this RN with future needs.  BP 100/58 mmHg  Pulse 76  Resp 16  Wt 170 lb 9.6 oz (77.384 kg)  SpO2 100% Wt Readings from Last 3 Encounters:  08/29/15 170 lb 9.6 oz (77.384 kg)  08/23/15 172 lb 3.2 oz (78.109 kg)  08/22/15 170 lb 12.8 oz (77.474 kg)

## 2015-08-30 ENCOUNTER — Ambulatory Visit
Admission: RE | Admit: 2015-08-30 | Discharge: 2015-08-30 | Disposition: A | Discharge status: Home / Self Care | Payer: Medicare Other | Source: Ambulatory Visit | Attending: Radiation Oncology | Admitting: Radiation Oncology

## 2015-08-30 DIAGNOSIS — Z510 Encounter for antineoplastic radiation therapy: Secondary | ICD-10-CM | POA: Diagnosis not present

## 2015-09-02 ENCOUNTER — Ambulatory Visit
Admission: RE | Admit: 2015-09-02 | Discharge: 2015-09-02 | Disposition: A | Discharge status: Home / Self Care | Payer: Medicare Other | Source: Ambulatory Visit | Attending: Radiation Oncology | Admitting: Radiation Oncology

## 2015-09-02 DIAGNOSIS — Z510 Encounter for antineoplastic radiation therapy: Secondary | ICD-10-CM | POA: Diagnosis not present

## 2015-09-04 ENCOUNTER — Ambulatory Visit
Admission: RE | Admit: 2015-09-04 | Discharge: 2015-09-04 | Disposition: A | Discharge status: Home / Self Care | Payer: Medicare Other | Source: Ambulatory Visit | Attending: Radiation Oncology | Admitting: Radiation Oncology

## 2015-09-04 ENCOUNTER — Ambulatory Visit: Payer: Medicare Other

## 2015-09-04 ENCOUNTER — Encounter: Payer: Self-pay | Admitting: Radiation Oncology

## 2015-09-04 DIAGNOSIS — Z510 Encounter for antineoplastic radiation therapy: Secondary | ICD-10-CM | POA: Diagnosis not present

## 2015-09-05 ENCOUNTER — Encounter: Payer: Self-pay | Admitting: *Deleted

## 2015-09-05 ENCOUNTER — Ambulatory Visit: Admission: RE | Admit: 2015-09-05 | Payer: Medicare Other | Source: Ambulatory Visit

## 2015-09-11 NOTE — Progress Notes (Signed)
  Radiation Oncology         [phone] ________________________________  Name: Fred Newton MRN: [id number]  Date: 09/04/2015  DOB: [birth year]  End of Treatment Note  Diagnosis: 82 year-old gentleman with muscle invasive bladder cancer     Indication for treatment: Curative  Radiation treatment dates:  07/15/15 - 09/04/15  Site/dose: 1) 45 Gy in 25 fractions to the bladder         2) 19.8 Gy boost in 11 fractions to the bladder  Beams/energy: 3D // 15X Photon  Narrative: The patient tolerated radiation treatment relatively well. The patient reported dysuria., nocturia x 1-2, a few instances of diarrhea, and fatigue.   Plan: The patient has completed radiation treatment. The patient will return to radiation oncology clinic for routine followup in one month. I advised him to call or return sooner if he has any questions or concerns related to his recovery or treatment. ________________________________  Sheral Apley. Tammi Klippel, M.D.  This document serves as a record of services personally performed by Tyler Pita, MD. It was created on his behalf by Darcus Austin, a trained medical scribe. The creation of this record is based on the scribe's personal observations and the provider's statements to them. This document has been checked and approved by the attending provider.

## 2015-10-03 ENCOUNTER — Ambulatory Visit
Admission: RE | Admit: 2015-10-03 | Discharge: 2015-10-03 | Disposition: A | Discharge status: Home / Self Care | Payer: Medicare Other | Source: Ambulatory Visit | Attending: Radiation Oncology | Admitting: Radiation Oncology

## 2015-10-03 ENCOUNTER — Encounter: Payer: Self-pay | Admitting: Radiation Oncology

## 2015-10-03 VITALS — BP 127/64 | HR 55 | Resp 16 | Wt 172.0 lb

## 2015-10-03 DIAGNOSIS — C672 Malignant neoplasm of lateral wall of bladder: Secondary | ICD-10-CM | POA: Diagnosis present

## 2015-10-03 NOTE — Progress Notes (Signed)
Department of Radiation Oncology  Phone:  [phone] Fax:        [phone]    Name: KHIREE NEWMAN MRN: [id number]  Date: 10/03/2015  DOB: 1928-04-26  Follow-Up Visit Note  CC: Haze Rushing, MD  Festus Aloe, MD  Diagnosis:   82 year-old gentleman with muscle invasive bladder cancer       ICD-9-CM ICD-10-CM   1. Malignant neoplasm of lateral wall of urinary bladder (HCC) 188.2 C67.2     Interval Since Last Radiation:  09/04/15, one month, 45 Gy with 19.8 Gy to bladder.   Narrative:  The patient returns today for routine follow-up. Weight and vitals stable. Denies pain. Reports dysuria has resolved. Denies hematuria. Reports nocturia x 1-3. Denies diarrhea but, reports bowels are loose. Reports moderate fatigue continues. Scheduled to follow up with urologist 8/18. Then, labs on 9/11 and Shadad 9/12.                               ALLERGIES:  is allergic to hibiclens [chlorhexidine].  Meds: Current Outpatient Prescriptions  Medication Sig Dispense Refill  . albuterol (PROVENTIL HFA;VENTOLIN HFA) 108 (90 BASE) MCG/ACT inhaler Inhale 2 puffs into the lungs every 6 (six) hours as needed for wheezing or shortness of breath.    Marland Kitchen aspirin EC 81 MG tablet Take 81 mg by mouth every morning.     . Calcium Carbonate-Vitamin D (CALCIUM 600+D) 600-400 MG-UNIT tablet Take 1 tablet by mouth daily.    . camphor-menthol (SARNA) lotion Apply 1 application topically 2 (two) times daily as needed for itching.    . cetirizine (ZYRTEC) 10 MG tablet Take 10 mg by mouth every morning.     . cycloSPORINE (RESTASIS) 0.05 % ophthalmic emulsion Place 1 drop into both eyes 2 (two) times daily as needed (Dry eyes).     Marland Kitchen esomeprazole (NEXIUM) 40 MG capsule Take 40 mg by mouth every evening.     . fluticasone (FLONASE) 50 MCG/ACT nasal spray Place 1 spray into both nostrils daily as needed for allergies or rhinitis.    . Fluticasone-Salmeterol (ADVAIR) 250-50 MCG/DOSE AEPB Inhale 1 puff into the  lungs 2 (two) times daily.    Marland Kitchen ketoconazole (NIZORAL) 2 % cream Apply 1 application topically 2 (two) times daily.     . Multiple Vitamin (MULTIVITAMIN WITH MINERALS) TABS tablet Take 1 tablet by mouth every morning.     . Omega-3 Fatty Acids (FISH OIL) 1000 MG CAPS Take 1,000 mg by mouth 2 (two) times daily.    . OXYGEN Inhale into the lungs. 2 LITERS AT NIGHT    . Psyllium (METAMUCIL) WAFR Take 2 Wafers by mouth daily.     . simvastatin (ZOCOR) 40 MG tablet Take 40 mg by mouth every evening.     . tamsulosin (FLOMAX) 0.4 MG CAPS capsule Take 0.4 mg by mouth every evening.     . traMADol (ULTRAM) 50 MG tablet Take 1 tablet (50 mg total) by mouth every 6 (six) hours as needed for moderate pain. 20 tablet 0  . triamcinolone cream (KENALOG) 0.1 % Apply 1 application topically daily as needed (Ithcing).      No current facility-administered medications for this encounter.    Facility-Administered Medications Ordered in Other Encounters  Medication Dose Route Frequency Provider Last Rate Last Dose  . epirubicin (ELLENCE) 50 mg in sodium chloride 0.9 % bladder instillation  50 mg Bladder Instillation Once Festus Aloe, MD  Physical Findings: The patient is in no acute distress. Patient is alert and oriented.  weight is 172 lb (78 kg). His blood pressure is 127/64 and his pulse is 55 (abnormal). His respiration is 16 and oxygen saturation is 100%. .  No significant changes.  Lab Findings: Lab Results  Component Value Date   WBC 3.8 (L) 08/22/2015   WBC 9.8 05/27/2015   HGB 10.2 (L) 08/22/2015   HCT 30.3 (L) 08/22/2015   PLT 102 (L) 08/22/2015    Lab Results  Component Value Date   NA 138 08/22/2015   K 4.3 08/22/2015   CHLORIDE 106 08/22/2015   CO2 25 08/22/2015   GLUCOSE 95 08/22/2015   BUN 30.2 (H) 08/22/2015   CREATININE 1.3 08/22/2015   BILITOT 0.47 08/22/2015   ALKPHOS 43 08/22/2015   AST 20 08/22/2015   ALT 16 08/22/2015   PROT 6.5 08/22/2015   ALBUMIN 3.5  08/22/2015   CALCIUM 8.9 08/22/2015   ANIONGAP 7 08/22/2015   ANIONGAP 8 05/27/2015    Radiographic Findings: No results found.  Impression:  The patient is recovering from the effects of radiation.    Plan: I will release the patient to the care of Dr. Alen Blew and Dr. Alger Simons. Call or return to clinic prn if these symptoms worsen or fail to improve as anticipated.   _____________________________________  Sheral Apley. Tammi Klippel, M.D.   This document serves as a record of services personally performed by Tyler Pita, MD. It was created on his behalf by Truddie Hidden, a trained medical scribe. The creation of this record is based on the scribe's personal observations and the provider's statements to them. This document has been checked and approved by the attending provider.

## 2015-10-03 NOTE — Progress Notes (Signed)
Weight and vitals stable. Denies pain. Reports dysuria has resolved. Denies hematuria. Reports nocturia x 1-3. Denies diarrhea but, reports bowels are loose. Reports moderate fatigue continues. Scheduled to follow up with urologist 8/18. Then, labs on 9/11 and Shadad 9/12.  BP 127/64 (BP Location: Right Arm, Patient Position: Sitting, Cuff Size: Normal)   Pulse (!) 55   Resp 16   Wt 172 lb (78 kg)   SpO2 100%   BMI 23.33 kg/m  Wt Readings from Last 3 Encounters:  10/03/15 172 lb (78 kg)  08/29/15 170 lb 9.6 oz (77.4 kg)  08/23/15 172 lb 3.2 oz (78.1 kg)

## 2015-11-11 ENCOUNTER — Other Ambulatory Visit (HOSPITAL_BASED_OUTPATIENT_CLINIC_OR_DEPARTMENT_OTHER): Payer: Medicare Other

## 2015-11-11 ENCOUNTER — Ambulatory Visit (HOSPITAL_COMMUNITY)
Admission: RE | Admit: 2015-11-11 | Discharge: 2015-11-11 | Disposition: A | Discharge status: Home / Self Care | Payer: Medicare Other | Source: Ambulatory Visit | Attending: Oncology | Admitting: Oncology

## 2015-11-11 DIAGNOSIS — J439 Emphysema, unspecified: Secondary | ICD-10-CM | POA: Diagnosis present

## 2015-11-11 DIAGNOSIS — M4854XA Collapsed vertebra, not elsewhere classified, thoracic region, initial encounter for fracture: Secondary | ICD-10-CM | POA: Insufficient documentation

## 2015-11-11 DIAGNOSIS — C672 Malignant neoplasm of lateral wall of bladder: Secondary | ICD-10-CM

## 2015-11-11 DIAGNOSIS — R911 Solitary pulmonary nodule: Secondary | ICD-10-CM | POA: Diagnosis not present

## 2015-11-11 LAB — CBC WITH DIFFERENTIAL/PLATELET
BASO%: 0.3 % (ref 0.0–2.0)
Basophils Absolute: 0 10*3/uL (ref 0.0–0.1)
EOS%: 2.5 % (ref 0.0–7.0)
Eosinophils Absolute: 0.2 10*3/uL (ref 0.0–0.5)
HCT: 28.5 % — ABNORMAL LOW (ref 38.4–49.9)
HEMOGLOBIN: 9.8 g/dL — AB (ref 13.0–17.1)
LYMPH%: 19.4 % (ref 14.0–49.0)
MCH: 34.1 pg — ABNORMAL HIGH (ref 27.2–33.4)
MCHC: 34.4 g/dL (ref 32.0–36.0)
MCV: 99.3 fL — ABNORMAL HIGH (ref 79.3–98.0)
MONO#: 0.7 10*3/uL (ref 0.1–0.9)
MONO%: 9.8 % (ref 0.0–14.0)
NEUT%: 68 % (ref 39.0–75.0)
NEUTROS ABS: 4.6 10*3/uL (ref 1.5–6.5)
Platelets: 172 10*3/uL (ref 140–400)
RBC: 2.87 10*6/uL — AB (ref 4.20–5.82)
RDW: 13.5 % (ref 11.0–14.6)
WBC: 6.8 10*3/uL (ref 4.0–10.3)
lymph#: 1.3 10*3/uL (ref 0.9–3.3)

## 2015-11-11 LAB — COMPREHENSIVE METABOLIC PANEL
ALT: 17 U/L (ref 0–55)
AST: 22 U/L (ref 5–34)
Albumin: 3.6 g/dL (ref 3.5–5.0)
Alkaline Phosphatase: 47 U/L (ref 40–150)
Anion Gap: 7 mEq/L (ref 3–11)
BILIRUBIN TOTAL: 0.53 mg/dL (ref 0.20–1.20)
BUN: 24.6 mg/dL (ref 7.0–26.0)
CO2: 26 meq/L (ref 22–29)
CREATININE: 1.5 mg/dL — AB (ref 0.7–1.3)
Calcium: 9.6 mg/dL (ref 8.4–10.4)
Chloride: 109 mEq/L (ref 98–109)
EGFR: 42 mL/min/{1.73_m2} — AB (ref >90)
GLUCOSE: 99 mg/dL (ref 70–140)
Potassium: 4.6 mEq/L (ref 3.5–5.1)
SODIUM: 142 meq/L (ref 136–145)
TOTAL PROTEIN: 6.9 g/dL (ref 6.4–8.3)

## 2015-11-11 MED ORDER — IOPAMIDOL (ISOVUE-300) INJECTION 61%
100.0000 mL | Freq: Once | INTRAVENOUS | Status: AC | PRN
  Administered 2015-11-11: 100 mL via INTRAVENOUS

## 2015-11-12 ENCOUNTER — Ambulatory Visit (HOSPITAL_BASED_OUTPATIENT_CLINIC_OR_DEPARTMENT_OTHER): Payer: Medicare Other | Admitting: Oncology

## 2015-11-12 ENCOUNTER — Telehealth: Payer: Self-pay | Admitting: Oncology

## 2015-11-12 VITALS — BP 100/51 | HR 78 | Temp 97.6°F | Wt 172.4 lb

## 2015-11-12 DIAGNOSIS — R0600 Dyspnea, unspecified: Secondary | ICD-10-CM | POA: Diagnosis not present

## 2015-11-12 DIAGNOSIS — R5383 Other fatigue: Secondary | ICD-10-CM | POA: Diagnosis not present

## 2015-11-12 DIAGNOSIS — C672 Malignant neoplasm of lateral wall of bladder: Secondary | ICD-10-CM

## 2015-11-12 DIAGNOSIS — N289 Disorder of kidney and ureter, unspecified: Secondary | ICD-10-CM | POA: Diagnosis not present

## 2015-11-12 DIAGNOSIS — J439 Emphysema, unspecified: Secondary | ICD-10-CM

## 2015-11-12 NOTE — Progress Notes (Signed)
Hematology and Oncology Follow Up Visit  Fred Newton [id number] 08/30/1928 82 y.o. 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    Prior Therapy: He is S/P TURBT in March 2017 and the pathology showed high-grade invasive urothelial carcinoma with invasion into the muscularis propria. CT scan of the abdomen and pelvis on 05/27/2015 showed a dominant enhancing lesion in the right posterior lateral aspect of the bladder wall but no evidence of any adenopathy.  Definitive radiation therapy with weekly carboplatin started on 07/18/2015. He status post 6 weeks of therapy concluded on 08/22/2015.  Current therapy: Observation and surveillance.  Interim History: Fred Newton presents today for a follow-up visit. Since the last visit, he reports feeling reasonably fair. He does not report any delayed complications related to chemotherapy except for residual fatigue. He does report some occasional exertional dyspnea but no cough or hemoptysis.  He denied any nausea or vomiting but did report constipation and diarrhea at times. He didn't report loose bowel movements after drinking contrast for his CT scan. He denied any hematuria or dysuria.   He is not report any headaches, blurry vision, syncope or seizures. He does not report any fevers, chills or sweats. He does not report any cough, wheezing or hemoptysis. Does not report any nausea, vomiting, abdominal pain, hematochezia or melena. He does not report any frequency, urgency or hesitancy. Does not report any skeletal complaints. Remaining review of systems unremarkable.  Medications: I have reviewed the patient's current medications.  Current Outpatient Prescriptions  Medication Sig Dispense Refill  . albuterol (PROVENTIL HFA;VENTOLIN HFA) 108 (90 BASE) MCG/ACT inhaler Inhale 2 puffs into  the lungs every 6 (six) hours as needed for wheezing or shortness of breath.    Marland Kitchen aspirin EC 81 MG tablet Take 81 mg by mouth every morning.     . Calcium Carbonate-Vitamin D (CALCIUM 600+D) 600-400 MG-UNIT tablet Take 1 tablet by mouth daily.    . camphor-menthol (SARNA) lotion Apply 1 application topically 2 (two) times daily as needed for itching.    . cetirizine (ZYRTEC) 10 MG tablet Take 10 mg by mouth every morning.     . cycloSPORINE (RESTASIS) 0.05 % ophthalmic emulsion Place 1 drop into both eyes 2 (two) times daily as needed (Dry eyes).     . fluticasone (FLONASE) 50 MCG/ACT nasal spray Place 1 spray into both nostrils daily as needed for allergies or rhinitis.    . Fluticasone-Salmeterol (ADVAIR) 250-50 MCG/DOSE AEPB Inhale 1 puff into the lungs 2 (two) times daily.    Marland Kitchen ketoconazole (NIZORAL) 2 % cream Apply 1 application topically 2 (two) times daily.     . Multiple Vitamin (MULTIVITAMIN WITH MINERALS) TABS tablet Take 1 tablet by mouth every morning.     . Omega-3 Fatty Acids (FISH OIL) 1000 MG CAPS Take 1,000 mg by mouth 2 (two) times daily.    . OXYGEN Inhale into the lungs. 2 LITERS AT NIGHT    . pantoprazole (PROTONIX) 40 MG tablet Take 40 mg by mouth daily.    . Psyllium (METAMUCIL) WAFR Take 2 Wafers by mouth daily.     . simvastatin (ZOCOR) 40 MG tablet Take 40 mg by mouth every evening.     . tamsulosin (FLOMAX) 0.4 MG CAPS capsule Take 0.4 mg by mouth every evening.     . triamcinolone cream (KENALOG) 0.1 %  Apply 1 application topically daily as needed (Ithcing).      No current facility-administered medications for this visit.    Facility-Administered Medications Ordered in Other Visits  Medication Dose Route Frequency Provider Last Rate Last Dose  . epirubicin (ELLENCE) 50 mg in sodium chloride 0.9 % bladder instillation  50 mg Bladder Instillation Once Festus Aloe, MD         Allergies:  Allergies  Allergen Reactions  . Hibiclens [Chlorhexidine] Itching,  Rash and Other (See Comments)    Patient stated,"it burns my skin."    Past Medical History, Surgical history, Social history, and Family History were reviewed and updated.  Review of systems: Please see history of present illness.  Physical Exam: Blood pressure (!) 100/51, pulse 78, temperature 97.6 F (36.4 C), temperature source Oral, weight 172 lb 6 oz (78.2 kg), SpO2 99 %. ECOG: 0 General appearance: Well-appearing gentleman without distress. Head: Normocephalic, without obvious abnormality no oral ulcers or lesions. Neck: no adenopathy Lymph nodes: Cervical, supraclavicular, and axillary nodes normal. Heart:regular rate and rhythm, S1, S2 normal, no murmur, click, rub or gallop Lung:chest clear, no wheezing, rales, normal symmetric air entry Abdomin: soft, non-tender, without masses or organomegaly no rebound or guarding. EXT:no erythema, induration, or nodules   Lab Results: Lab Results  Component Value Date   WBC 6.8 11/11/2015   HGB 9.8 (L) 11/11/2015   HCT 28.5 (L) 11/11/2015   MCV 99.3 (H) 11/11/2015   PLT 172 11/11/2015     Chemistry      Component Value Date/Time   NA 142 11/11/2015 1339   K 4.6 11/11/2015 1339   CL 106 05/27/2015 1135   CO2 26 11/11/2015 1339   BUN 24.6 11/11/2015 1339   CREATININE 1.5 (H) 11/11/2015 1339      Component Value Date/Time   CALCIUM 9.6 11/11/2015 1339   ALKPHOS 47 11/11/2015 1339   AST 22 11/11/2015 1339   ALT 17 11/11/2015 1339   BILITOT 0.53 11/11/2015 1339      EXAM: CT CHEST, ABDOMEN, AND PELVIS WITH CONTRAST  TECHNIQUE: Multidetector CT imaging of the chest, abdomen and pelvis was performed following the standard protocol during bolus administration of intravenous contrast.  CONTRAST:  132mL ISOVUE-300 IOPAMIDOL (ISOVUE-300) INJECTION 61%  COMPARISON:  05/27/2015 CT abdomen/ pelvis.  4239 chest CT.  FINDINGS: CT CHEST FINDINGS  Cardiovascular: Normal heart size. No significant  pericardial fluid/thickening. Left anterior descending and left circumflex coronary atherosclerosis. Atherosclerotic nonaneurysmal thoracic aorta. Normal caliber pulmonary arteries. No central pulmonary emboli.  Mediastinum/Nodes: No discrete thyroid nodules. There is a fluid level in the lower third of the thoracic esophagus without appreciable esophageal wall thickening. No pathologically enlarged axillary, mediastinal or hilar lymph nodes.  Lungs/Pleura: No pneumothorax. No pleural effusion. There is moderate to severe centrilobular and paraseptal emphysema with severe bullous emphysema in the apical left upper lobe. There is diffuse bronchial wall thickening. Surgical suture line is seen in the apical right upper lobe. There is a new 4 mm solid anterior right lower lobe pulmonary nodule (series 4/ image 61). No acute consolidative airspace disease, lung masses or new significant pulmonary nodules. Subpleural bandlike consolidation, distortion and volume loss in the posterior apical right upper lobe is most consistent with pleural-parenchymal scarring.  Musculoskeletal: No aggressive appearing focal osseous lesions. There is a mild superior T5 vertebral compression fracture with associated patchy sclerotic change, new since 07/21/2004 chest CT angiogram, chronic appearing. Moderate thoracic spondylosis.  CT ABDOMEN PELVIS FINDINGS  Hepatobiliary: Normal liver size.  Left liver lobe 0.3 cm hypodense liver lesion (series 2/ image 54) is too small to characterize and appears stable back to 02/07/2010, consistent with a benign lesion. No new liver lesions. There is a subcentimeter calcified gallstone in the nondistended gallbladder, with no gallbladder wall thickening or pericholecystic fluid. No biliary ductal dilatation.  Pancreas: Diffuse fatty infiltration of the otherwise normal pancreas, with no pancreatic mass or duct dilation.  Spleen: Normal size. No  mass.  Adrenals/Urinary Tract: Normal adrenals. No hydronephrosis. No renal mass. On delayed imaging, there is no urothelial wall thickening and there are no filling defects in the opacified portions of the bilateral collecting systems or ureters. Stable mild diffuse bladder wall thickening with no focal bladder mass. Previously described focal right posterior bladder mass has been resected.  Stomach/Bowel: Grossly normal stomach. Normal caliber small bowel with no small bowel wall thickening. Normal appendix . Mild sigmoid diverticulosis, with no large bowel wall thickening or pericolonic fat stranding.  Vascular/Lymphatic: Abdominal aortic atherosclerosis with infrarenal abdominal aortic ectasia, maximum diameter 2.8 cm, unchanged. Patent portal, splenic, hepatic and renal veins. No pathologically enlarged lymph nodes in the abdomen or pelvis.  Reproductive: Top-normal size prostate, stable.  Other: No pneumoperitoneum, ascites or focal fluid collection.  Musculoskeletal: No aggressive appearing focal osseous lesions. Moderate lumbar spondylosis.  IMPRESSION: 1. No evidence of metastatic disease in the abdomen or pelvis. 2. Stable mild chronic diffuse bladder wall thickening with no focal bladder abnormality. 3. Solitary 4 mm solid right lower lobe pulmonary nodule is new since the most recent comparison chest CT of 06/23/2007 and is indeterminate. Follow-up chest CT is advised in 3-6 months. 4. Mild T5 vertebral compression fracture is new since 2006, although appears chronic. 5. Additional findings include aortic atherosclerosis, ectatic infrarenal abdominal aorta with maximum diameter 2.8 cm, coronary atherosclerosis, moderate to severe emphysema with bullous change at the left lung apex, cholelithiasis and mild sigmoid diverticulosis.   Impression and Plan:  82 year old gentleman with the following issues:  1. Transitional cell carcinoma of the bladder  presented with muscle invasive disease and a tumor in the right posterior bladder. He is status post TURBT which showed a T2 disease and CT scan showed no evidence of lymphadenopathy or metastatic disease.  He is S/P definitive radiation therapy with weekly carboplatin. He tolerated therapy well without major complications which concluded in June 2017.  CT scan obtained on 11/11/2015 was personally reviewed and showed no evidence of metastatic disease. The plan is to continue with active surveillance and repeat imaging studies 6 months. He did have a recent cystoscopy with did not show evidence of recurrent disease.  2. Constipation: Bowel movements are regular at this time.  3. Renal insufficiency: His creatinine  increased recently to close to 1.5.  4. Fatigue and dyspnea: This could be curative complications related to previous therapy could also be related to his emphysema. We will continue to monitor this moving forward.  5. Follow-up: In 6 months follow-up on his progress and repeat CT scan at that time.   [facility], MD 9/12/201712:24 PM

## 2015-11-12 NOTE — Telephone Encounter (Signed)
Avs report and schedule given per 11/12/15 los. °

## 2016-02-22 ENCOUNTER — Emergency Department (HOSPITAL_COMMUNITY)
Admission: EM | Admit: 2016-02-22 | Discharge: 2016-02-22 | Disposition: A | Discharge status: Home / Self Care | Payer: Medicare Other | Attending: Emergency Medicine | Admitting: Emergency Medicine

## 2016-02-22 DIAGNOSIS — R55 Syncope and collapse: Secondary | ICD-10-CM | POA: Insufficient documentation

## 2016-02-22 DIAGNOSIS — J449 Chronic obstructive pulmonary disease, unspecified: Secondary | ICD-10-CM | POA: Insufficient documentation

## 2016-02-22 DIAGNOSIS — Z87891 Personal history of nicotine dependence: Secondary | ICD-10-CM | POA: Insufficient documentation

## 2016-02-22 DIAGNOSIS — I2510 Atherosclerotic heart disease of native coronary artery without angina pectoris: Secondary | ICD-10-CM | POA: Insufficient documentation

## 2016-02-22 DIAGNOSIS — Z7982 Long term (current) use of aspirin: Secondary | ICD-10-CM | POA: Insufficient documentation

## 2016-02-22 DIAGNOSIS — Z8551 Personal history of malignant neoplasm of bladder: Secondary | ICD-10-CM | POA: Diagnosis not present

## 2016-02-22 DIAGNOSIS — Z8546 Personal history of malignant neoplasm of prostate: Secondary | ICD-10-CM | POA: Diagnosis not present

## 2016-02-22 LAB — COMPREHENSIVE METABOLIC PANEL
ALT: 16 U/L — ABNORMAL LOW (ref 17–63)
AST: 24 U/L (ref 15–41)
Albumin: 4 g/dL (ref 3.5–5.0)
Alkaline Phosphatase: 39 U/L (ref 38–126)
Anion gap: 7 (ref 5–15)
BILIRUBIN TOTAL: 0.7 mg/dL (ref 0.3–1.2)
BUN: 27 mg/dL — AB (ref 6–20)
CO2: 27 mmol/L (ref 22–32)
CREATININE: 1.49 mg/dL — AB (ref 0.61–1.24)
Calcium: 9.2 mg/dL (ref 8.9–10.3)
Chloride: 103 mmol/L (ref 101–111)
GFR calc Af Amer: 48 mL/min — ABNORMAL LOW (ref >60)
GFR, EST NON AFRICAN AMERICAN: 41 mL/min — AB (ref >60)
Glucose, Bld: 112 mg/dL — ABNORMAL HIGH (ref 65–99)
Potassium: 4.1 mmol/L (ref 3.5–5.1)
Sodium: 137 mmol/L (ref 135–145)
TOTAL PROTEIN: 7.2 g/dL (ref 6.5–8.1)

## 2016-02-22 LAB — I-STAT TROPONIN, ED
TROPONIN I, POC: 0 ng/mL (ref 0.00–0.08)
Troponin i, poc: 0 ng/mL (ref 0.00–0.08)

## 2016-02-22 LAB — CBC
HEMATOCRIT: 30 % — AB (ref 39.0–52.0)
Hemoglobin: 10.3 g/dL — ABNORMAL LOW (ref 13.0–17.0)
MCH: 32.6 pg (ref 26.0–34.0)
MCHC: 34.3 g/dL (ref 30.0–36.0)
MCV: 94.9 fL (ref 78.0–100.0)
Platelets: 222 10*3/uL (ref 150–400)
RBC: 3.16 MIL/uL — ABNORMAL LOW (ref 4.22–5.81)
RDW: 14 % (ref 11.5–15.5)
WBC: 7.6 10*3/uL (ref 4.0–10.5)

## 2016-02-22 LAB — URINALYSIS, ROUTINE W REFLEX MICROSCOPIC
Bilirubin Urine: NEGATIVE
GLUCOSE, UA: NEGATIVE mg/dL
Hgb urine dipstick: NEGATIVE
KETONES UR: NEGATIVE mg/dL
LEUKOCYTES UA: NEGATIVE
Nitrite: NEGATIVE
PH: 5 (ref 5.0–8.0)
Protein, ur: NEGATIVE mg/dL
Specific Gravity, Urine: 1.011 (ref 1.005–1.030)

## 2016-02-22 MED ORDER — SODIUM CHLORIDE 0.9 % IV BOLUS (SEPSIS)
1000.0000 mL | Freq: Once | INTRAVENOUS | Status: AC
  Administered 2016-02-22: 1000 mL via INTRAVENOUS

## 2016-02-22 NOTE — ED Notes (Signed)
Assumed care of patient from [facility].

## 2016-02-22 NOTE — ED Notes (Signed)
Pt given lunch tray as requested and allowed by EDP.

## 2016-02-22 NOTE — Discharge Instructions (Signed)
Stay hydrated.   Take your meds as prescribed.   See your doctor.   Return to Er if you have severe abdominal pain, passing out, chest pain, worse dizziness

## 2016-02-22 NOTE — ED Notes (Signed)
Patient currently using urinal

## 2016-02-22 NOTE — ED Triage Notes (Signed)
Pt lives with wife but was at family's house and had a witnessed syncopal episode.  Reported to be less than a minute that he was unconscious.  He was lowered to the ground.  Pt has no complaints and denies having syncope but is orthostatic per EMS.  Lying BP: 109/60, Sitting BP: 104/56, Standing: 75/45 and was asymptomatic per EMS.  SL to LFA #20.  HX bladder CA, LT decrease of pulm fx (RA baseline sats around 93%).

## 2016-02-22 NOTE — ED Notes (Signed)
Bed: [id number] Expected date: 02/22/16 Expected time: 11:32 AM Means of arrival: Ambulance Comments: Orthostatic

## 2016-02-22 NOTE — ED Provider Notes (Signed)
[facility] DEPT Provider Note   CSN: [id number] Arrival date & time: 02/22/16  1136     History   Chief Complaint Chief Complaint  Patient presents with  . Loss of Consciousness    HPI Fred Newton is a 82 y.o. male hx of AAA (2.8 cm 3 months ago, no surgery), COPD, here presenting with near syncope. Patient states that he was at the kitchen counter and felt lightheaded dizzy like his is going to pass out. His wife then caught him and he did not fall. Patient came by EMS, who noticed that his BP was 70s when he stood up and he was orthostatic. He ate breakfast this morning but didn't eat a lot. Denies chest pain or abdominal pain or vomiting or fevers. Had previous episode of syncope several years ago but no cause was found. Had previous bladder tumor but is in remission as per the CT chest/ab/pel 3 months ago.   The history is provided by the patient.    Past Medical History:  Diagnosis Date  . Abdominal aortic aneurysm ([place])   . Aortic aneurysm ([place])    ABDOMEN CT [facility] 05-23-15 [facility] SHOWS ON TRUE AAA  . Cancer [place]) 2011   Prostate  . COPD (chronic obstructive pulmonary disease) ([place])   . Coronary artery disease   . Cough    with green phlegm last week or so, chest xray 10-03-14 dr vyas on chart  . Dementia   . DJD (degenerative joint disease)    neck  . Dysrhythmia    hx of heart racing - 2006   . Emphysema of lung ([place])   . GERD (gastroesophageal reflux disease)   . Hearing loss    MINOR  . History of home oxygen therapy    oxygen concentrator 2 liters at hs and prn  . Pneumothorax    hx of 1984 right lung   . Shortness of breath    WITH EXERTION    Patient Active Problem List   Diagnosis Date Noted  . Bladder cancer ([place]) 07/05/2015  . EMPHYSEMATOUS BLEB 06/23/2007  . C O P D 06/23/2007  . PULMONARY NODULE, LEFT LOWER LOBE 06/23/2007  . ABDOMINAL PAIN, LEFT LOWER QUADRANT 06/23/2007  . CHRONIC OBSTRUCTIVE PULMONARY DISEASE, ACUTE  EXACERBATION 03/24/2007  . HYPERLIPIDEMIA 12/30/2006  . POSTNASAL DRIP SYNDROME 12/30/2006  . TACHYARRHYTHMIA 12/30/2006  . SHINGLES, HX OF 12/30/2006    Past Surgical History:  Procedure Laterality Date  . cataract surgery   08/2011  . cataract surgery   2013   right eye  . CYSTOSCOPY W/ RETROGRADES Bilateral 10/05/2014   Procedure: CYSTOSCOPY WITH BILATERAL RETROGRADE PYELOGRAM;  Surgeon: Festus Aloe, MD;  Location: [facility];  Service: Urology;  Laterality: Bilateral;  C-ARM      . CYSTOSCOPY WITH BIOPSY N/A 10/05/2014   Procedure: CYSTOSCOPY WITH BIOPSY;  Surgeon: Festus Aloe, MD;  Location: [facility];  Service: Urology;  Laterality: N/A;  . left leg surgery  1951  . left wrist surgery  1938  . PROSTATECTOMY  2007  . rectal prolapse, hemorrhoidectomy   2015  . removal of sinus polyps   1969  . Right Lung Surgery  1984  . sinus polyp surgery Bilateral 1969   [facility]  . TRANSURETHRAL RESECTION OF BLADDER TUMOR N/A 10/27/2013   Procedure: TRANSURETHRAL RESECTION OF BLADDER TUMOR (TURBT) WITH FULGURATION MITOMYCIN C/POSSIBLE JJ STENT  ;  Surgeon: Festus Aloe, MD;  Location: [facility];  Service: Urology;  Laterality: N/A;  .  TRANSURETHRAL RESECTION OF BLADDER TUMOR N/A 05/28/2015   Procedure: TRANSURETHRAL RESECTION OF BLADDER TUMOR (TURBT);  Surgeon: Festus Aloe, MD;  Location: [facility];  Service: Urology;  Laterality: N/A;       Home Medications    Prior to Admission medications   Medication Sig Start Date End Date Taking? Authorizing Provider  albuterol (PROVENTIL HFA;VENTOLIN HFA) 108 (90 BASE) MCG/ACT inhaler Inhale 2 puffs into the lungs every 6 (six) hours as needed for wheezing or shortness of breath.    Historical Provider, MD  aspirin EC 81 MG tablet Take 81 mg by mouth every morning.     Historical Provider, MD  Calcium Carbonate-Vitamin D (CALCIUM 600+D) 600-400 MG-UNIT tablet Take 1 tablet by mouth daily.    Historical Provider, MD  camphor-menthol  Timoteo Ace) lotion Apply 1 application topically 2 (two) times daily as needed for itching.    Historical Provider, MD  cetirizine (ZYRTEC) 10 MG tablet Take 10 mg by mouth every morning.     Historical Provider, MD  cycloSPORINE (RESTASIS) 0.05 % ophthalmic emulsion Place 1 drop into both eyes 2 (two) times daily as needed (Dry eyes).     Historical Provider, MD  fluticasone (FLONASE) 50 MCG/ACT nasal spray Place 1 spray into both nostrils daily as needed for allergies or rhinitis.    Historical Provider, MD  Fluticasone-Salmeterol (ADVAIR) 250-50 MCG/DOSE AEPB Inhale 1 puff into the lungs 2 (two) times daily.    Historical Provider, MD  ketoconazole (NIZORAL) 2 % cream Apply 1 application topically 2 (two) times daily.  04/15/15   Historical Provider, MD  Multiple Vitamin (MULTIVITAMIN WITH MINERALS) TABS tablet Take 1 tablet by mouth every morning.     Historical Provider, MD  Omega-3 Fatty Acids (FISH OIL) 1000 MG CAPS Take 1,000 mg by mouth 2 (two) times daily.    Historical Provider, MD  OXYGEN Inhale into the lungs. 2 LITERS AT NIGHT    Historical Provider, MD  pantoprazole (PROTONIX) 40 MG tablet Take 40 mg by mouth daily. 10/25/15   Historical Provider, MD  Psyllium (METAMUCIL) WAFR Take 2 Wafers by mouth daily.     Historical Provider, MD  simvastatin (ZOCOR) 40 MG tablet Take 40 mg by mouth every evening.     Historical Provider, MD  tamsulosin (FLOMAX) 0.4 MG CAPS capsule Take 0.4 mg by mouth every evening.     Historical Provider, MD  triamcinolone cream (KENALOG) 0.1 % Apply 1 application topically daily as needed (Ithcing).  04/15/15   Historical Provider, MD    Family History Family History  Problem Relation Age of Onset  . Colon cancer Mother   . Heart Problems Brother   . Multiple myeloma Sister   . Hyperlipidemia Sister   . Mental illness Sister   . Heart attack Sister     Social History Social History  Substance Use Topics  . Smoking status: Former Smoker    Packs/day:  2.00    Years: 50.00    Quit date: 01/30/2006  . Smokeless tobacco: Never Used  . Alcohol use No     Allergies   Hibiclens [chlorhexidine]   Review of Systems Review of Systems  Cardiovascular: Positive for syncope.  Neurological: Positive for dizziness.  All other systems reviewed and are negative.    Physical Exam Updated Vital Signs BP 95/73 (BP Location: Right Arm)   Pulse 95   Temp 97.9 F (36.6 C) (Oral)   Resp 18   Ht '6\' 1"'  (1.854 m)   Wt 172  lb (78 kg)   SpO2 95%   BMI 22.69 kg/m   Physical Exam  Constitutional: He is oriented to person, place, and time.  Chronically ill, slightly dehydrated   HENT:  Head: Normocephalic.  Mouth/Throat: Oropharynx is clear and moist.  Eyes: EOM are normal. Pupils are equal, round, and reactive to light.  Neck: Normal range of motion. Neck supple.  Cardiovascular: Normal rate, regular rhythm and normal heart sounds.   Pulmonary/Chest: Effort normal and breath sounds normal. No respiratory distress. He has no wheezes. He has no rales.  Abdominal: Soft. Bowel sounds are normal.  No pulsatile mass   Musculoskeletal: Normal range of motion.  Good peripheral pulses   Neurological: He is alert and oriented to person, place, and time. No cranial nerve deficit. Coordination normal.  Skin: Skin is warm.  Psychiatric: He has a normal mood and affect.  Nursing note and vitals reviewed.    ED Treatments / Results  Labs (all labs ordered are listed, but only abnormal results are displayed) Labs Reviewed  CBC - Abnormal; Notable for the following:       Result Value   RBC 3.16 (*)    Hemoglobin 10.3 (*)    HCT 30.0 (*)    All other components within normal limits  COMPREHENSIVE METABOLIC PANEL - Abnormal; Notable for the following:    Glucose, Bld 112 (*)    BUN 27 (*)    Creatinine, Ser 1.49 (*)    ALT 16 (*)    GFR calc non Af Amer 41 (*)    GFR calc Af Amer 48 (*)    All other components within normal limits    URINALYSIS, ROUTINE W REFLEX MICROSCOPIC  CBG MONITORING, ED  I-STAT TROPOININ, ED  I-STAT TROPOININ, ED    EKG  EKG Interpretation  Date/Time:  Saturday February 22 2016 12:40:03 EST Ventricular Rate:  72 PR Interval:    QRS Duration: 93 QT Interval:  396 QTC Calculation: 434 R Axis:   -28 Text Interpretation:  Sinus rhythm Prolonged PR interval Borderline left axis deviation Low voltage, precordial leads No significant change since last tracing Confirmed by Yaremi Stahlman  MD, Quindon Denker ([id number]) on 02/22/2016 12:45:18 PM       Radiology No results found.  Procedures Procedures (including critical care time)  Medications Ordered in ED Medications  sodium chloride 0.9 % bolus 1,000 mL (0 mLs Intravenous Stopped 02/22/16 1552)     Initial Impression / Assessment and Plan / ED Course  I have reviewed the triage vital signs and the nursing notes.  Pertinent labs & imaging results that were available during my care of the patient were reviewed by me and considered in my medical decision making (see chart for details).  Clinical Course     Fred Newton is a 82 y.o. male here with near syncope. Will check labs, orthostatics, UA, trop x 2. Likely from dehydration.   4:32 PM Was orthostatic initially, given 1 L NS bolus and orthostasis improved and he is not symptomatic from it. Abdomen remains nontender. Had known 2.8 cm AAA but has no pulsatile mass to suggest AAA rupture and he is not hypotensive currently. Delta trop neg. Will dc home.    Final Clinical Impressions(s) / ED Diagnoses   Final diagnoses:  None    New Prescriptions New Prescriptions   No medications on file     Drenda Freeze, MD 02/22/16 [phone]

## 2016-02-29 ENCOUNTER — Encounter (HOSPITAL_COMMUNITY): Payer: Self-pay

## 2016-05-07 ENCOUNTER — Telehealth: Payer: Self-pay | Admitting: Oncology

## 2016-05-07 NOTE — Telephone Encounter (Signed)
Patient's Wife called to cancel appointment on 05/12/16 and 05/13/16 due to patient currently in the hospital. States that they will call back to reschedule once he is home

## 2016-05-12 ENCOUNTER — Other Ambulatory Visit: Payer: Medicare Other

## 2016-05-13 ENCOUNTER — Ambulatory Visit: Payer: Medicare Other | Admitting: Oncology

## 2016-05-16 ENCOUNTER — Telehealth: Payer: Self-pay | Admitting: Oncology

## 2016-05-16 NOTE — Telephone Encounter (Signed)
Step son answered the phone and said he would take a message. He is aware of new date and time and will let Patient know.

## 2016-06-11 ENCOUNTER — Encounter: Payer: Self-pay | Admitting: *Deleted

## 2016-06-11 ENCOUNTER — Other Ambulatory Visit (HOSPITAL_BASED_OUTPATIENT_CLINIC_OR_DEPARTMENT_OTHER): Payer: Medicare Other

## 2016-06-11 ENCOUNTER — Ambulatory Visit (HOSPITAL_COMMUNITY)
Admission: RE | Admit: 2016-06-11 | Discharge: 2016-06-11 | Disposition: A | Discharge status: Home / Self Care | Payer: Medicare Other | Source: Ambulatory Visit | Attending: Oncology | Admitting: Oncology

## 2016-06-11 ENCOUNTER — Other Ambulatory Visit: Payer: Self-pay | Admitting: *Deleted

## 2016-06-11 DIAGNOSIS — C672 Malignant neoplasm of lateral wall of bladder: Secondary | ICD-10-CM

## 2016-06-11 DIAGNOSIS — R918 Other nonspecific abnormal finding of lung field: Secondary | ICD-10-CM | POA: Insufficient documentation

## 2016-06-11 DIAGNOSIS — I700 Atherosclerosis of aorta: Secondary | ICD-10-CM | POA: Diagnosis not present

## 2016-06-11 DIAGNOSIS — C670 Malignant neoplasm of trigone of bladder: Secondary | ICD-10-CM

## 2016-06-11 DIAGNOSIS — J439 Emphysema, unspecified: Secondary | ICD-10-CM | POA: Insufficient documentation

## 2016-06-11 DIAGNOSIS — I714 Abdominal aortic aneurysm, without rupture: Secondary | ICD-10-CM | POA: Diagnosis not present

## 2016-06-11 LAB — CBC WITH DIFFERENTIAL/PLATELET
BASO%: 0.1 % (ref 0.0–2.0)
BASOS ABS: 0 10*3/uL (ref 0.0–0.1)
EOS%: 1.5 % (ref 0.0–7.0)
Eosinophils Absolute: 0.1 10*3/uL (ref 0.0–0.5)
HEMATOCRIT: 28.8 % — AB (ref 38.4–49.9)
HEMOGLOBIN: 9.7 g/dL — AB (ref 13.0–17.1)
LYMPH%: 30.6 % (ref 14.0–49.0)
MCH: 32.6 pg (ref 27.2–33.4)
MCHC: 33.7 g/dL (ref 32.0–36.0)
MCV: 96.6 fL (ref 79.3–98.0)
MONO#: 0.9 10*3/uL (ref 0.1–0.9)
MONO%: 12.7 % (ref 0.0–14.0)
NEUT#: 3.7 10*3/uL (ref 1.5–6.5)
NEUT%: 55.1 % (ref 39.0–75.0)
Platelets: 252 10*3/uL (ref 140–400)
RBC: 2.98 10*6/uL — ABNORMAL LOW (ref 4.20–5.82)
RDW: 14.9 % — ABNORMAL HIGH (ref 11.0–14.6)
WBC: 6.8 10*3/uL (ref 4.0–10.3)
lymph#: 2.1 10*3/uL (ref 0.9–3.3)

## 2016-06-11 LAB — COMPREHENSIVE METABOLIC PANEL
ALBUMIN: 3.3 g/dL — AB (ref 3.5–5.0)
ALK PHOS: 63 U/L (ref 40–150)
ALT: 31 U/L (ref 0–55)
AST: 33 U/L (ref 5–34)
Anion Gap: 9 mEq/L (ref 3–11)
BILIRUBIN TOTAL: 0.5 mg/dL (ref 0.20–1.20)
BUN: 26.5 mg/dL — AB (ref 7.0–26.0)
CALCIUM: 9.2 mg/dL (ref 8.4–10.4)
CO2: 27 mEq/L (ref 22–29)
CREATININE: 1.3 mg/dL (ref 0.7–1.3)
Chloride: 105 mEq/L (ref 98–109)
EGFR: 48 mL/min/{1.73_m2} — ABNORMAL LOW (ref >90)
Glucose: 145 mg/dl — ABNORMAL HIGH (ref 70–140)
Potassium: 3.6 mEq/L (ref 3.5–5.1)
Sodium: 141 mEq/L (ref 136–145)
TOTAL PROTEIN: 6.3 g/dL — AB (ref 6.4–8.3)

## 2016-06-11 LAB — URIC ACID: URIC ACID, SERUM: 6.8 mg/dL (ref 2.6–7.4)

## 2016-06-11 MED ORDER — IOPAMIDOL (ISOVUE-300) INJECTION 61%
INTRAVENOUS | Status: AC
  Filled 2016-06-11: qty 30

## 2016-06-11 MED ORDER — IOPAMIDOL (ISOVUE-300) INJECTION 61%
100.0000 mL | Freq: Once | INTRAVENOUS | Status: AC | PRN
  Administered 2016-06-11: 100 mL via INTRAVENOUS

## 2016-06-12 ENCOUNTER — Telehealth: Payer: Self-pay | Admitting: Oncology

## 2016-06-12 ENCOUNTER — Ambulatory Visit (HOSPITAL_BASED_OUTPATIENT_CLINIC_OR_DEPARTMENT_OTHER): Payer: Medicare Other | Admitting: Oncology

## 2016-06-12 VITALS — BP 108/63 | HR 84 | Temp 97.6°F | Resp 18 | Ht 73.0 in | Wt 169.1 lb

## 2016-06-12 DIAGNOSIS — C678 Malignant neoplasm of overlapping sites of bladder: Secondary | ICD-10-CM

## 2016-06-12 DIAGNOSIS — N289 Disorder of kidney and ureter, unspecified: Secondary | ICD-10-CM | POA: Diagnosis not present

## 2016-06-12 DIAGNOSIS — R0603 Acute respiratory distress: Secondary | ICD-10-CM | POA: Diagnosis not present

## 2016-06-12 DIAGNOSIS — C672 Malignant neoplasm of lateral wall of bladder: Secondary | ICD-10-CM | POA: Diagnosis present

## 2016-06-12 NOTE — Telephone Encounter (Signed)
Gave patient AVS and calender per 4/13 los.  

## 2016-06-12 NOTE — Progress Notes (Signed)
Hematology and Oncology Follow Up Visit  Fred Newton [id number] 07/26/1928 83 y.o. 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    Prior Therapy: He is S/P TURBT in March 2017 and the pathology showed high-grade invasive urothelial carcinoma with invasion into the muscularis propria. CT scan of the abdomen and pelvis on 05/27/2015 showed a dominant enhancing lesion in the right posterior lateral aspect of the bladder wall but no evidence of any adenopathy.  Definitive radiation therapy with weekly carboplatin started on 07/18/2015. He status post 6 weeks of therapy concluded on 08/22/2015.  Current therapy: Observation and surveillance.  Interim History: Fred Newton presents today for a follow-up visit with his wife. Since the last visit, he was hospitalized in March 2018 for pneumonia and respiratory distress. He has recovered at this time although he is continues to be dyspneic on exertion. He does require oxygen because of hypoxia when he exerts himself. He denied any nausea or vomiting but did report constipation and diarrhea at times. He didn't report loose bowel movements after drinking contrast for his CT scan. He denied any hematuria or dysuria.   He is not report any headaches, blurry vision, syncope or seizures. He does not report any fevers, chills or sweats. He does not report any cough, wheezing or hemoptysis. Does not report any nausea, vomiting, abdominal pain, hematochezia or melena. He does not report any frequency, urgency or hesitancy. Does not report any skeletal complaints. Remaining review of systems unremarkable.  Medications: I have reviewed the patient's current medications.  Current Outpatient Prescriptions  Medication Sig Dispense Refill  . albuterol (PROVENTIL HFA;VENTOLIN HFA) 108 (90 BASE) MCG/ACT inhaler  Inhale 2 puffs into the lungs every 6 (six) hours as needed for wheezing or shortness of breath.    Marland Kitchen aspirin EC 81 MG tablet Take 81 mg by mouth every morning.     . Calcium Carbonate-Vitamin D (CALCIUM 600+D) 600-400 MG-UNIT tablet Take 1 tablet by mouth daily.    . camphor-menthol (SARNA) lotion Apply 1 application topically 2 (two) times daily as needed for itching.    . cetirizine (ZYRTEC) 10 MG tablet Take 10 mg by mouth every morning.     . cycloSPORINE (RESTASIS) 0.05 % ophthalmic emulsion Place 1 drop into both eyes 2 (two) times daily as needed (Dry eyes).     . fluticasone (FLONASE) 50 MCG/ACT nasal spray Place 1 spray into both nostrils daily as needed for allergies or rhinitis.    . Fluticasone-Salmeterol (ADVAIR) 250-50 MCG/DOSE AEPB Inhale 1 puff into the lungs 2 (two) times daily.    Marland Kitchen ketoconazole (NIZORAL) 2 % cream Apply 1 application topically 2 (two) times daily.     . Multiple Vitamin (MULTIVITAMIN WITH MINERALS) TABS tablet Take 1 tablet by mouth every morning.     . Omega-3 Fatty Acids (FISH OIL) 1000 MG CAPS Take 1,000 mg by mouth 2 (two) times daily.    . OXYGEN Inhale into the lungs. 2 LITERS AT NIGHT    . pantoprazole (PROTONIX) 40 MG tablet Take 40 mg by mouth daily.    . Psyllium (METAMUCIL) WAFR Take 2 Wafers by mouth daily.     . simvastatin (ZOCOR) 40 MG tablet Take 40 mg by mouth every evening.     . tamsulosin (FLOMAX) 0.4 MG CAPS capsule Take 0.4 mg by mouth every evening.     Marland Kitchen  triamcinolone cream (KENALOG) 0.1 % Apply 1 application topically daily as needed (Ithcing).      No current facility-administered medications for this visit.    Facility-Administered Medications Ordered in Other Visits  Medication Dose Route Frequency Provider Last Rate Last Dose  . epirubicin (ELLENCE) 50 mg in sodium chloride 0.9 % bladder instillation  50 mg Bladder Instillation Once Festus Aloe, MD         Allergies:  Allergies  Allergen Reactions  . Hibiclens  [Chlorhexidine] Itching, Rash and Other (See Comments)    Patient stated,"it burns my skin."    Past Medical History, Surgical history, Social history, and Family History were reviewed and updated.  Review of systems: Please see history of present illness.  Physical Exam: Blood pressure 108/63, pulse 84, temperature 97.6 F (36.4 C), temperature source Oral, resp. rate 18, height 6\' 1"  (1.854 m), weight 169 lb 1.6 oz (76.7 kg), SpO2 97 %. ECOG: 0 General appearance: Alert, awake gentleman appeared without distress. Head: Normocephalic, without obvious abnormality no oral thrush or ulcers. Neck: no adenopathy Lymph nodes: Cervical, supraclavicular, and axillary nodes normal. Heart:regular rate and rhythm, S1, S2 normal, no murmur, click, rub or gallop Lung:chest clear, expiratory wheezes noted. normal symmetric air entry Abdomin: soft, non-tender, without masses or organomegaly no shifting dullness or ascites. EXT:no erythema, induration, or nodules. And edema noted bilaterally.   Lab Results: Lab Results  Component Value Date   WBC 6.8 06/11/2016   HGB 9.7 (L) 06/11/2016   HCT 28.8 (L) 06/11/2016   MCV 96.6 06/11/2016   PLT 252 06/11/2016     Chemistry      Component Value Date/Time   NA 141 06/11/2016 1227   K 3.6 06/11/2016 1227   CL 103 02/22/2016 1247   CO2 27 06/11/2016 1227   BUN 26.5 (H) 06/11/2016 1227   CREATININE 1.3 06/11/2016 1227      Component Value Date/Time   CALCIUM 9.2 06/11/2016 1227   ALKPHOS 63 06/11/2016 1227   AST 33 06/11/2016 1227   ALT 31 06/11/2016 1227   BILITOT 0.50 06/11/2016 1227     EXAM: CT CHEST, ABDOMEN, AND PELVIS WITH CONTRAST  TECHNIQUE: Multidetector CT imaging of the chest, abdomen and pelvis was performed following the standard protocol during bolus administration of intravenous contrast.  CONTRAST:  140mL ISOVUE-300 IOPAMIDOL (ISOVUE-300) INJECTION 61%  COMPARISON:  CT scan 11/11/2015  FINDINGS: CT CHEST  FINDINGS  Chest wall: No chest wall mass, supraclavicular or axillary lymphadenopathy. The thyroid gland appears normal.  Cardiovascular: The heart is normal in size. No pericardial effusion. Moderate tortuosity, ectasia and calcification of the thoracic aorta. Branch vessels are patent. Stable coronary artery calcifications.  Mediastinum/Nodes: No mediastinal or hilar mass or lymphadenopathy. Small scattered lymph nodes are stable. Esophagus is grossly normal.  Lungs/Pleura: Severe emphysematous changes and pulmonary scarring along with surgical changes at the right lung apex. Very large left apical bulla.  Airspace opacity in the superior segment right lower lobe with air bronchograms could be infiltrate or atelectasis. No discrete mass. New irregular nodular density in the right middle lobe best seen on sagittal image number 50. This measures a maximum of 11 mm. Recommend close CT follow-up versus PET-CT. Stable subpleural densities in the left upper lobe anteriorly.  Musculoskeletal: No significant bony findings.  CT ABDOMEN PELVIS FINDINGS  Hepatobiliary: No focal hepatic lesions or intrahepatic biliary dilatation. The gallbladder is normal. No common bile duct dilatation.  Pancreas: Stable diffuse fatty change involving the pancreas with speckled  calcifications likely due to prior inflammatory disease. No mass, inflammation or ductal dilatation.  Spleen: Normal size.  No focal lesions.  Adrenals/Urinary Tract: The adrenal glands and kidneys are unremarkable and stable. No worrisome renal lesions. No ureteral or bladder calculi. No CT findings worrisome for recurrent bladder cancer. The renal collecting systems are normal bilaterally on the delayed images and both ureters are normal. The  Stomach/Bowel: The stomach, duodenum, small bowel and colon are unremarkable. No inflammatory changes, mass lesions or obstructive findings. Stable scattered colonic  diverticulosis but no findings for acute diverticulitis. The terminal ileum is normal. The appendix is normal.  Vascular/Lymphatic: Stable atherosclerotic calcifications involving the aorta. The branch vessels are patent. The major venous structures are patent.  No mesenteric or retroperitoneal mass or lymphadenopathy. There is a stable cystic area along the inferior margin of the right hepatic lobe which could be an exophytic cyst or a benign peritoneal inclusion cyst.  Reproductive: The prostate gland and seminal vesicles are unremarkable.  Other: Stable bilateral inguinal hernias containing fat. No abdominal wall hernia or subcutaneous lesions. No pelvic mass or adenopathy. No free pelvic fluid collections.  Musculoskeletal: No significant bony findings.  IMPRESSION: 1. Severe emphysematous changes and pulmonary scarring. 2. Right lower lobe airspace process with air bronchograms is most likely pneumonia and possibly aspiration. No obvious mass. There is also an 11 mm irregular nodular density in the right middle lobe which could be inflammatory or infectious. Recommend short-term followup noncontrast chest CT in 3 months to evaluate these findings. 3. No mediastinal or hilar mass or lymphadenopathy. 4. No acute abdominal/pelvic findings, mass lesions or adenopathy. No evidence of recurrent bladder cancer or metastatic disease in the abdomen. 5. Stable advanced atherosclerotic calcifications involving the aorta and iliac arteries. Stable small infrarenal abdominal aortic aneurysm measuring 2.8 cm.   Impression and Plan:  83 year old gentleman with the following issues:  1. Transitional cell carcinoma of the bladder presented with muscle invasive disease and a tumor in the right posterior bladder. He is status post TURBT which showed a T2 disease and CT scan showed no evidence of lymphadenopathy or metastatic disease.  He is S/P definitive radiation therapy with  weekly carboplatin. He tolerated therapy well without major complications which concluded in June 2017.  CT scan obtained on 06/11/2016 did not show any evidence of metastatic disease. I recommended continued observation and surveillance and repeat CT scan in 6 months.  2. Respiratory distress: He has emphysema at baseline and recent pneumonia. He appears to be improving although it is require oxygen.  3. Renal insufficiency: His creatinine  increased recently to close to 1.5.  4. Follow-up: In 6 months to repeat his CT scan.   [facility], MD 4/13/20183:41 PM

## 2016-07-08 ENCOUNTER — Encounter: Payer: Self-pay | Admitting: *Deleted

## 2016-08-13 ENCOUNTER — Telehealth: Payer: Self-pay | Admitting: *Deleted

## 2016-08-13 NOTE — Telephone Encounter (Signed)
"  I was not given my lab results when I was sen in April.  Could these be mailed to me.  I have a Doctors appointment next Tuesday, would like to bring results.  Could you mail results?""  Mail sent through hospital system takes longer.  Results routed via Epic to Dr. Woody Seller and copies mailed to patient. Denies further needs or questions.

## 2016-12-10 ENCOUNTER — Other Ambulatory Visit (HOSPITAL_BASED_OUTPATIENT_CLINIC_OR_DEPARTMENT_OTHER): Payer: Medicare Other

## 2016-12-10 ENCOUNTER — Ambulatory Visit (HOSPITAL_COMMUNITY)
Admission: RE | Admit: 2016-12-10 | Discharge: 2016-12-10 | Disposition: A | Discharge status: Home / Self Care | Payer: Medicare Other | Source: Ambulatory Visit | Attending: Oncology | Admitting: Oncology

## 2016-12-10 DIAGNOSIS — C672 Malignant neoplasm of lateral wall of bladder: Secondary | ICD-10-CM

## 2016-12-10 DIAGNOSIS — K8020 Calculus of gallbladder without cholecystitis without obstruction: Secondary | ICD-10-CM | POA: Insufficient documentation

## 2016-12-10 DIAGNOSIS — K449 Diaphragmatic hernia without obstruction or gangrene: Secondary | ICD-10-CM | POA: Insufficient documentation

## 2016-12-10 DIAGNOSIS — C678 Malignant neoplasm of overlapping sites of bladder: Secondary | ICD-10-CM | POA: Insufficient documentation

## 2016-12-10 DIAGNOSIS — J439 Emphysema, unspecified: Secondary | ICD-10-CM | POA: Diagnosis not present

## 2016-12-10 DIAGNOSIS — I700 Atherosclerosis of aorta: Secondary | ICD-10-CM | POA: Diagnosis not present

## 2016-12-10 LAB — CBC WITH DIFFERENTIAL/PLATELET
BASO%: 0.5 % (ref 0.0–2.0)
Basophils Absolute: 0 10*3/uL (ref 0.0–0.1)
EOS%: 2.5 % (ref 0.0–7.0)
Eosinophils Absolute: 0.2 10*3/uL (ref 0.0–0.5)
HEMATOCRIT: 32.5 % — AB (ref 38.4–49.9)
HEMOGLOBIN: 11 g/dL — AB (ref 13.0–17.1)
LYMPH#: 2.1 10*3/uL (ref 0.9–3.3)
LYMPH%: 32.1 % (ref 14.0–49.0)
MCH: 33.2 pg (ref 27.2–33.4)
MCHC: 33.9 g/dL (ref 32.0–36.0)
MCV: 97.9 fL (ref 79.3–98.0)
MONO#: 0.7 10*3/uL (ref 0.1–0.9)
MONO%: 11.1 % (ref 0.0–14.0)
NEUT#: 3.5 10*3/uL (ref 1.5–6.5)
NEUT%: 53.8 % (ref 39.0–75.0)
Platelets: 224 10*3/uL (ref 140–400)
RBC: 3.32 10*6/uL — AB (ref 4.20–5.82)
RDW: 13.5 % (ref 11.0–14.6)
WBC: 6.6 10*3/uL (ref 4.0–10.3)

## 2016-12-10 LAB — COMPREHENSIVE METABOLIC PANEL
ALT: 16 U/L (ref 0–55)
AST: 25 U/L (ref 5–34)
Albumin: 3.7 g/dL (ref 3.5–5.0)
Alkaline Phosphatase: 52 U/L (ref 40–150)
Anion Gap: 8 mEq/L (ref 3–11)
BUN: 31 mg/dL — AB (ref 7.0–26.0)
CO2: 25 meq/L (ref 22–29)
CREATININE: 1.4 mg/dL — AB (ref 0.7–1.3)
Calcium: 9.5 mg/dL (ref 8.4–10.4)
Chloride: 106 mEq/L (ref 98–109)
EGFR: 44 mL/min/{1.73_m2} — ABNORMAL LOW (ref >60)
GLUCOSE: 81 mg/dL (ref 70–140)
Potassium: 4.8 mEq/L (ref 3.5–5.1)
SODIUM: 139 meq/L (ref 136–145)
Total Bilirubin: 0.48 mg/dL (ref 0.20–1.20)
Total Protein: 7.1 g/dL (ref 6.4–8.3)

## 2016-12-11 ENCOUNTER — Ambulatory Visit (HOSPITAL_BASED_OUTPATIENT_CLINIC_OR_DEPARTMENT_OTHER): Payer: Medicare Other | Admitting: Oncology

## 2016-12-11 ENCOUNTER — Telehealth: Payer: Self-pay | Admitting: Oncology

## 2016-12-11 VITALS — BP 103/63 | HR 88 | Temp 97.7°F | Resp 18 | Ht 73.0 in | Wt 167.6 lb

## 2016-12-11 DIAGNOSIS — C672 Malignant neoplasm of lateral wall of bladder: Secondary | ICD-10-CM | POA: Diagnosis present

## 2016-12-11 DIAGNOSIS — N289 Disorder of kidney and ureter, unspecified: Secondary | ICD-10-CM | POA: Diagnosis not present

## 2016-12-11 DIAGNOSIS — C678 Malignant neoplasm of overlapping sites of bladder: Secondary | ICD-10-CM

## 2016-12-11 NOTE — Telephone Encounter (Signed)
Scheduled appt per 10/12 los - Gave patient AVS and calender per los.

## 2016-12-11 NOTE — Progress Notes (Signed)
Hematology and Oncology Follow Up Visit  Fred Newton MINUS [id number] 1928-10-01 83 y.o. 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    Prior Therapy: He is S/P TURBT in March 2017 and the pathology showed high-grade invasive urothelial carcinoma with invasion into the muscularis propria. CT scan of the abdomen and pelvis on 05/27/2015 showed a dominant enhancing lesion in the right posterior lateral aspect of the bladder wall but no evidence of any adenopathy.  Definitive radiation therapy with weekly carboplatin started on 07/18/2015. He status post 6 weeks of therapy concluded on 08/22/2015.  Current therapy: Observation and surveillance.  Interim History: Mr. Fred Newton presents today for a follow-up visit. Since the last visit, he reports feeling well and fully recovered from his pneumonia earlier this year. He denies any dyspneic on exertion. He does not report any cough or wheezing. He denied any nausea or vomiting but did report constipation and diarrhea at times. Did not report any hematuria or flank pain. He remains active and continues to attend to her activities of daily living. He is still able to drive for his appointment without any difficulties. He is eating well and has been exercising regularly.   He is not report any headaches, blurry vision, syncope or seizures. He does not report any fevers, chills or sweats. He does not report any cough, wheezing or hemoptysis. Does not report any nausea, vomiting, abdominal pain, hematochezia or melena. He does not report any frequency, urgency or hesitancy. Does not report any skeletal complaints. Remaining review of systems unremarkable.  Medications: I have reviewed the patient's current medications.  Current Outpatient Prescriptions  Medication Sig Dispense Refill  . albuterol  (PROVENTIL HFA;VENTOLIN HFA) 108 (90 BASE) MCG/ACT inhaler Inhale 2 puffs into the lungs every 6 (six) hours as needed for wheezing or shortness of breath.    Marland Kitchen aspirin EC 81 MG tablet Take 81 mg by mouth every morning.     . Calcium Carbonate-Vitamin D (CALCIUM 600+D) 600-400 MG-UNIT tablet Take 1 tablet by mouth daily.    . camphor-menthol (SARNA) lotion Apply 1 application topically 2 (two) times daily as needed for itching.    . cetirizine (ZYRTEC) 10 MG tablet Take 10 mg by mouth every morning.     . cycloSPORINE (RESTASIS) 0.05 % ophthalmic emulsion Place 1 drop into both eyes 2 (two) times daily as needed (Dry eyes).     . fluticasone (FLONASE) 50 MCG/ACT nasal spray Place 1 spray into both nostrils daily as needed for allergies or rhinitis.    . Fluticasone-Salmeterol (ADVAIR) 250-50 MCG/DOSE AEPB Inhale 1 puff into the lungs 2 (two) times daily.    . Multiple Vitamin (MULTIVITAMIN WITH MINERALS) TABS tablet Take 1 tablet by mouth every morning.     . Omega-3 Fatty Acids (FISH OIL) 1000 MG CAPS Take 1,000 mg by mouth 2 (two) times daily.    . OXYGEN Inhale into the lungs. 2 LITERS AT NIGHT    . pantoprazole (PROTONIX) 40 MG tablet Take 40 mg by mouth daily.    . Psyllium (METAMUCIL) WAFR Take 2 Wafers by mouth daily.     . simvastatin (ZOCOR) 40 MG tablet Take 40 mg by mouth every evening.     . tamsulosin (FLOMAX) 0.4 MG CAPS capsule Take 0.4 mg by mouth every evening.      No current facility-administered medications for  this visit.    Facility-Administered Medications Ordered in Other Visits  Medication Dose Route Frequency Provider Last Rate Last Dose  . epirubicin (ELLENCE) 50 mg in sodium chloride 0.9 % bladder instillation  50 mg Bladder Instillation Once Festus Aloe, MD         Allergies:  Allergies  Allergen Reactions  . Hibiclens [Chlorhexidine] Itching, Rash and Other (See Comments)    Patient stated,"it burns my skin."    Past Medical History, Surgical  history, Social history, and Family History were reviewed and updated.  Review of systems: Please see history of present illness.  Physical Exam: Blood pressure 103/63, pulse 88, temperature 97.7 F (36.5 C), temperature source Oral, resp. rate 18, height 6\' 1"  (1.854 m), weight 167 lb 9.6 oz (76 kg), SpO2 90 %. ECOG: 0 General appearance: Alert, awake gentleman appeared comfortable Head: Normocephalic, without obvious abnormality no oral ulcers or lesions. Neck: no adenopathy or masses. Lymph nodes: Cervical, supraclavicular, and axillary nodes normal. Heart:regular rate and rhythm, S1, S2 normal, no murmur, click, rub or gallop Lung:chest clear, expiratory wheezes noted. normal symmetric air entry Abdomin: soft, non-tender, without masses or organomegaly no shifting dullness or ascites. EXT:no edema noted.   Lab Results: Lab Results  Component Value Date   WBC 6.6 12/10/2016   HGB 11.0 (L) 12/10/2016   HCT 32.5 (L) 12/10/2016   MCV 97.9 12/10/2016   PLT 224 12/10/2016     Chemistry      Component Value Date/Time   NA 139 12/10/2016 1308   K 4.8 12/10/2016 1308   CL 103 02/22/2016 1247   CO2 25 12/10/2016 1308   BUN 31.0 (H) 12/10/2016 1308   CREATININE 1.4 (H) 12/10/2016 1308      Component Value Date/Time   CALCIUM 9.5 12/10/2016 1308   ALKPHOS 52 12/10/2016 1308   AST 25 12/10/2016 1308   ALT 16 12/10/2016 1308   BILITOT 0.48 12/10/2016 1308      EXAM: CT ABDOMEN AND PELVIS WITHOUT CONTRAST  TECHNIQUE: Multidetector CT imaging of the abdomen and pelvis was performed following the standard protocol without IV contrast.  COMPARISON:  06/11/2016  FINDINGS: Lower chest: Severe bullous type emphysema at the lung bases. Right base scarring. Normal heart size without pericardial or pleural effusion. Contrast in the distal esophagus with tiny hiatal hernia.  Hepatobiliary: Probable exophytic right hepatic lobe 2.3 cm cyst, similar. No suspicious liver  lesion. Gallstone without acute cholecystitis or biliary duct dilatation.  Pancreas: Fatty atrophy/replacement throughout the pancreas. Findings of chronic calcific pancreatitis. No duct dilatation or acute inflammation.  Spleen: Normal in size, without focal abnormality.  Adrenals/Urinary Tract: Normal adrenal glands. No renal calculi or hydronephrosis. No hydroureter or ureteric calculi. No bladder calculi. No dominant recurrent or residual bladder mass.  Stomach/Bowel: Normal remainder of the stomach. Scattered colonic diverticula. Normal terminal ileum and appendix. Normal small bowel.  Vascular/Lymphatic: Advanced aortic and branch vessel atherosclerosis. Infrarenal aortic dilatation of maximally 2.6 cm, similar. No abdominopelvic adenopathy.  Reproductive: Normal prostate.  Other: No significant free fluid. Small bilateral fat containing inguinal hernias.  Musculoskeletal: Osteopenia.  Mild right hemidiaphragm elevation.  IMPRESSION: 1. No acute process or evidence of metastatic disease in the abdomen or pelvis. 2. Tiny hiatal hernia. Esophageal air fluid level suggests dysmotility or gastroesophageal reflux. 3.  Emphysema (ICD10-J43.9). 4. Aortic Atherosclerosis (ICD10-I70.0). Nonaneurysmal infrarenal dilatation, similar. 5. Cholelithiasis.   Impression and Plan:  83 year old gentleman with the following issues:  1. Transitional cell carcinoma of the bladder presented  with muscle invasive disease and a tumor in the right posterior bladder. He is status post TURBT which showed a T2 disease and CT scan showed no evidence of lymphadenopathy or metastatic disease.  He is S/P definitive radiation therapy with weekly carboplatin. He tolerated therapy well without major complications which concluded in June 2017.  CT scan of the abdomen and pelvis obtained on 12/10/2016 showed no evidence of recurrent disease. The plan is to continue with active surveillance  and repeat imaging studies in June 2019.   He will continue to follow with urology for repeated cystoscopy.  2. Respiratory distress: He has emphysema at baseline and recent pneumonia. His symptoms have resolved at this time.  3. Renal insufficiency: His creatinine remains at baseline close to 1.4.  4. Follow-up: In 8 months to repeat his CT scan.   Zola Button, MD 10/12/201812:50 PM

## 2017-03-22 ENCOUNTER — Other Ambulatory Visit: Payer: Self-pay | Admitting: Urology

## 2017-03-22 MED ORDER — SODIUM CHLORIDE 0.9 % IV SOLN: 80.0000 mg | Freq: Once | INTRAVENOUS | Status: AC

## 2017-04-08 NOTE — Patient Instructions (Addendum)
Fred Newton  04/08/2017   Your procedure is scheduled on: 04-13-17   Report to [facility] Main  Entrance    Follow signs to Short Stay on first floor at 530 AM    Call this number if you have problems the morning of surgery [phone]     Remember: Do not eat food or drink liquids :After Midnight.     Take these medicines the morning of surgery with A SIP OF WATER: cetirizine, inhaler if needed, nasal spray if needed                                You may not have any metal on your body including hair pins and              piercings  Do not wear jewelry, make-up, lotions, powders or perfumes, deodorant                          Men may shave face and neck.   Do not bring valuables to the hospital. [place].  Contacts, dentures or bridgework may not be worn into surgery.  Leave suitcase in the car. After surgery it may be brought to your room.                 Please read over the following fact sheets you were given: _____________________________________________________________________  DUE TO YOUR HIBICLENS(CHLROHEXADINE) ALLERGY, PLEASE USE "GOLD" DIAL SOAP FOR YOUR SURGERY SHOWERS. FOLLOW ALL DIRECTIONS AS NOTED BELOW.            [place] - Preparing for Surgery Before surgery, you can play an important role.  Because skin is not sterile, your skin needs to be as free of germs as possible.  You can reduce the number of germs on your skin by washing with CHG (chlorahexidine gluconate) soap before surgery.  CHG is an antiseptic cleaner which kills germs and bonds with the skin to continue killing germs even after washing. Please DO NOT use if you have an allergy to CHG or antibacterial soaps.  If your skin becomes reddened/irritated stop using the CHG and inform your nurse when you arrive at Short Stay. Do not shave (including legs and underarms) for at least 48 hours prior to the first CHG shower.   You may shave your face/neck. Please follow these instructions carefully:  1.  Shower with CHG Soap the night before surgery and the  morning of Surgery.  2.  If you choose to wash your hair, wash your hair first as usual with your  normal  shampoo.  3.  After you shampoo, rinse your hair and body thoroughly to remove the  shampoo.                           4.  Use CHG as you would any other liquid soap.  You can apply chg directly  to the skin and wash                       Gently with a scrungie or clean washcloth.  5.  Apply the CHG Soap to your body ONLY  FROM THE NECK DOWN.   Do not use on face/ open                           Wound or open sores. Avoid contact with eyes, ears mouth and genitals (private parts).                       Wash face,  Genitals (private parts) with your normal soap.             6.  Wash thoroughly, paying special attention to the area where your surgery  will be performed.  7.  Thoroughly rinse your body with warm water from the neck down.  8.  DO NOT shower/wash with your normal soap after using and rinsing off  the CHG Soap.                9.  Pat yourself dry with a clean towel.            10.  Wear clean pajamas.            11.  Place clean sheets on your bed the night of your first shower and do not  sleep with pets. Day of Surgery : Do not apply any lotions/deodorants the morning of surgery.  Please wear clean clothes to the hospital/surgery center.  FAILURE TO FOLLOW THESE INSTRUCTIONS MAY RESULT IN THE CANCELLATION OF YOUR SURGERY PATIENT SIGNATURE_________________________________  NURSE SIGNATURE__________________________________  ________________________________________________________________________

## 2017-04-08 NOTE — Progress Notes (Signed)
Fax requesting sent to Dr Gurney Maxin office for LOV, labs, ekg, stress, echo

## 2017-04-08 NOTE — Progress Notes (Signed)
LOV Oncology Dr Rodena Piety 12-11-16 epic  CT abdomen 12-11-16 epic   CXR 09-29-16 epic, care everywhere

## 2017-04-09 ENCOUNTER — Encounter (HOSPITAL_COMMUNITY)
Admission: RE | Admit: 2017-04-09 | Discharge: 2017-04-09 | Disposition: A | Discharge status: Home / Self Care | Payer: Medicare Other | Source: Ambulatory Visit | Attending: Urology | Admitting: Urology

## 2017-04-09 ENCOUNTER — Other Ambulatory Visit: Payer: Self-pay

## 2017-04-09 ENCOUNTER — Encounter (HOSPITAL_COMMUNITY): Payer: Self-pay

## 2017-04-09 DIAGNOSIS — N529 Male erectile dysfunction, unspecified: Secondary | ICD-10-CM | POA: Diagnosis not present

## 2017-04-09 DIAGNOSIS — Z01818 Encounter for other preprocedural examination: Secondary | ICD-10-CM | POA: Insufficient documentation

## 2017-04-09 DIAGNOSIS — L539 Erythematous condition, unspecified: Secondary | ICD-10-CM | POA: Diagnosis not present

## 2017-04-09 DIAGNOSIS — E119 Type 2 diabetes mellitus without complications: Secondary | ICD-10-CM | POA: Diagnosis not present

## 2017-04-09 HISTORY — DX: Iron deficiency: E61.1

## 2017-04-09 HISTORY — DX: Hyperlipidemia, unspecified: E78.5

## 2017-04-09 HISTORY — DX: Peripheral vascular disease, unspecified: I73.9

## 2017-04-09 LAB — CBC
HEMATOCRIT: 31.1 % — AB (ref 39.0–52.0)
HEMOGLOBIN: 10.6 g/dL — AB (ref 13.0–17.0)
MCH: 32.2 pg (ref 26.0–34.0)
MCHC: 34.1 g/dL (ref 30.0–36.0)
MCV: 94.5 fL (ref 78.0–100.0)
Platelets: 248 10*3/uL (ref 150–400)
RBC: 3.29 MIL/uL — AB (ref 4.22–5.81)
RDW: 13.9 % (ref 11.5–15.5)
WBC: 6.8 10*3/uL (ref 4.0–10.5)

## 2017-04-09 LAB — HEMOGLOBIN A1C
HEMOGLOBIN A1C: 5.9 % — AB (ref 4.8–5.6)
MEAN PLASMA GLUCOSE: 122.63 mg/dL

## 2017-04-09 LAB — BASIC METABOLIC PANEL
Anion gap: 5 (ref 5–15)
BUN: 21 mg/dL — ABNORMAL HIGH (ref 6–20)
CALCIUM: 8.8 mg/dL — AB (ref 8.9–10.3)
CO2: 28 mmol/L (ref 22–32)
Chloride: 108 mmol/L (ref 101–111)
Creatinine, Ser: 1.2 mg/dL (ref 0.61–1.24)
GFR, EST NON AFRICAN AMERICAN: 53 mL/min — AB (ref >60)
Glucose, Bld: 95 mg/dL (ref 65–99)
POTASSIUM: 4.3 mmol/L (ref 3.5–5.1)
Sodium: 141 mmol/L (ref 135–145)

## 2017-04-09 NOTE — Progress Notes (Signed)
ECHO 12-30-16, 12-09-11 ON CHART FROM [facility]   STRESS TEST 07-03-15, 01-05-12 ON CHART FROM [facility]   EKG 12-30-16, 02-22-16 ON CHART FROM [facility]   CT ABDOMEN 12-10-16 Epic   BILATERAL CAROTID ARTERIAL DOPPLER 12-30-16 ON CHART FROM [facility]   LABS (03-10-17,02-18-17, 12-30-16, 05-18-16, 04-15-16) ON CHART FROM [facility]   CT CHEST 05-18-16 ON CHART FROM [facility]   LOV DR Dennison Mascot VYAS 03-10-17 ON CHART FROM [facility]   LOV ONCOLOGY DR Alen Blew 12-11-16 Epic

## 2017-04-09 NOTE — Progress Notes (Signed)
RN spoke with Dr Montez Hageman of anesthesia concerning patients history  of aortic valve stenosis. Dr Marcell Barlow satisfied with "mild" status of aortic valve stenosis seen on ECHO done 11-2016. RN also made Dr Marcell Barlow aware of patients regular exercise routine.  Per Marcell Barlow, no further recommendations, may proceed with surgery as scheduled.

## 2017-04-12 NOTE — Anesthesia Preprocedure Evaluation (Addendum)
Anesthesia Evaluation  Patient identified by MRN, date of birth, ID band Patient awake    Reviewed: Allergy & Precautions, NPO status , Patient's Chart, lab work & pertinent test results  History of Anesthesia Complications Negative for: history of anesthetic complications  Airway Mallampati: II  TM Distance: >3 FB Neck ROM: Full    Dental  (+) Chipped, Missing, Dental Advisory Given   Pulmonary COPD,  COPD inhaler and oxygen dependent, former smoker,    breath sounds clear to auscultation       Cardiovascular + Peripheral Vascular Disease ('18 abd CT: non-aneurysmal infra-renal dilation 2.6cm)  (-) CAD + dysrhythmias Atrial Fibrillation  Rhythm:Regular Rate:Normal  '18 ECHO: EF 55-60%, mild AS   Neuro/Psych Anxiety negative neurological ROS     GI/Hepatic Neg liver ROS, GERD  Medicated and Controlled,  Endo/Other  neg diabetes  Renal/GU negative Renal ROS     Musculoskeletal   Abdominal   Peds  Hematology negative hematology ROS (+)   Anesthesia Other Findings   Reproductive/Obstetrics                            Anesthesia Physical Anesthesia Plan  ASA: III  Anesthesia Plan: General   Post-op Pain Management:    Induction: Intravenous  PONV Risk Score and Plan: 2 and Ondansetron, Dexamethasone and Treatment may vary due to age or medical condition  Airway Management Planned: LMA  Additional Equipment:   Intra-op Plan:   Post-operative Plan:   Informed Consent: I have reviewed the patients History and Physical, chart, labs and discussed the procedure including the risks, benefits and alternatives for the proposed anesthesia with the patient or authorized representative who has indicated his/her understanding and acceptance.   Dental advisory given  Plan Discussed with: CRNA and Surgeon  Anesthesia Plan Comments:         Anesthesia Quick Evaluation

## 2017-04-12 NOTE — H&P (Signed)
Office Visit Report     03/29/2017   --------------------------------------------------------------------------------   Fred Newton  MRN: [id number]  PRIMARY CARE:  Glenda Chroman, MD  DOB: 25-Nov-1928, 84 year old Male  REFERRING:  Georgette Dover, MD  SSN: -[id number]  PROVIDER:  Festus Aloe, M.D.    TREATING:  Azucena Fallen    LOCATION:  [facility] [phone]   --------------------------------------------------------------------------------   CC/HPI: 03/29/17: He returns today for preoperative appointment. He is scheduled for IPP placement with cystoscopy and bladder biopsy on 2/12. He denies any significant changes in his overall health since he was seen last. He denies any dysuria, gross hematuria, suprapubic pain, or fevers. He is still interesting in moving forward with procedures, as planned.     ALLERGIES: Nitrofurantoin CAPS Omnicef CAPS    MEDICATIONS: Advair Diskus 250 mcg-50 mcg/dose blister, with inhalation device Inhalation  Aspirin 81 MG TABS Oral  Centrum Silver tablet Oral  Fish Oil CAPS Oral  Flonase 50 mcg/actuation spray, suspension Nasal  Fludrocortisone Acetate 0.1 mg tablet  Metamucil 1.7 GM WAFR Oral  Oxygen Use  Oyster Shell + D 250 mg calcium (625 mg)-125 unit tablet Oral  Protonix  Simvastatin 40 mg tablet Oral  Spiriva 18 mcg capsule, with inhalation device Inhalation  Tamsulosin Hcl 0.4 mg capsule Oral  ZyrTEC Allergy TABS Oral     GU PSH: Bladder Instill AntiCA Agent - 2015 Cryoablation Prostate - 2012 Cysto Fulgurate < 0.5 cm - 10/18/2014 Cystoscopy - 02/02/2017, 06/30/2016, 02/07/2016, 10/23/2015 Cystoscopy TURBT 2-5 cm - 05/31/2015, 2015      PSH Notes: Cystoscopy With Fulguration Medium Lesion (2-5cm), Cystoscopy With Fulguration Minor Lesion (Under 85mm), Cystoscopy With Fulguration Medium Lesion (2-5cm), Bladder Injection Of Cancer Treatment, Surgery Prostate Cryosurgical Ablation, Lung Surgery   NON-GU PSH: Lung  Surgery (Unspecified) - 2008    GU PMH: Bladder Cancer Lateral - 02/02/2017, - 06/30/2016, - 02/07/2016, - 10/23/2015, Malignant neoplasm of lateral wall of urinary bladder, - 06/18/2015 ED due to arterial insufficiency - 02/02/2017, Erectile dysfunction due to arterial insufficiency, - 2015 Prostate Cancer - 02/02/2017, - 02/07/2016, Prostate cancer, - 05/07/2015 Chronic Kidney Disease, Chronic renal insufficiency - 05/07/2015 Urinary Tract Inf, Unspec site, Pyuria - 02/09/2015, Urinary tract infection, - 2014 Other microscopic hematuria, Microscopic hematuria - 2016 Gross hematuria, Gross hematuria - 2015 Bladder-neck stenosis/contracture, Bladder neck contracture - 2014 BPH w/LUTS, Benign Prostatic Hypertrophy With Urinary Obstruction - 2014, Benign prostatic hyperplasia with urinary obstruction, - 2014 Inflammatory Disease Prostate, Unspec, Prostatitis - 2014 Peyronies Disease, Peyronie's disease - 2014 Residual Hemorrhoid Tags, External hemorrhoids - 2014 Urinary Retention, Unspec, Urinary retention - 2014 Urinary Urgency, Urinary urgency - 2014    NON-GU PMH: Tinea cruris, Tinea cruris - 12/22/2014 Encounter for general adult medical examination without abnormal findings, Encounter for preventive health examination - 10/10/2014 Personal history of other diseases of the respiratory system, History of bronchitis - 2015 Encounter for other preprocedural examination, Preop general physical exam - 2015 Arrhythmia, Rhythm Disorder - 2014 COPD, Chronic Obstructive Pulmonary Disease - 2014 Decreased libido, Decreased libido - 2014 Personal history of other endocrine, nutritional and metabolic disease, History of hypercholesterolemia - 2014 Personal history of other specified conditions, History of heartburn - 2014 Unspecified atrial fibrillation, Atrial Fibrillation - 2014    FAMILY HISTORY: Colon Cancer - Mother Death In The Family Father - Father Death In The Family Mother - Mother Family Health  Status Number - Runs In Family Tuberculosis - Sister   SOCIAL  HISTORY: Marital Status: Married Preferred Language: English; Race: White Current Smoking Status: Patient does not smoke anymore.      Notes: Former smoker, Occupation:, Tobacco Use, Marital History - Currently Married, Caffeine Use, Alcohol Use   REVIEW OF SYSTEMS:    GU Review Male:   Patient reports erection problems. Patient denies frequent urination, hard to postpone urination, burning/ pain with urination, get up at night to urinate, leakage of urine, stream starts and stops, trouble starting your stream, have to strain to urinate , and penile pain.  Gastrointestinal (Upper):   Patient denies nausea, vomiting, and indigestion/ heartburn.  Gastrointestinal (Lower):   Patient denies diarrhea and constipation.  Constitutional:   Patient denies fever, night sweats, weight loss, and fatigue.  Skin:   Patient denies skin rash/ lesion and itching.  Eyes:   Patient denies blurred vision and double vision.  Ears/ Nose/ Throat:   Patient denies sore throat and sinus problems.  Hematologic/Lymphatic:   Patient reports easy bruising. Patient denies swollen glands.  Cardiovascular:   Patient denies leg swelling and chest pains.  Respiratory:   Patient reports shortness of breath. Patient denies cough.  Endocrine:   Patient denies excessive thirst.  Musculoskeletal:   Patient denies back pain and joint pain.  Neurological:   Patient denies headaches and dizziness.  Psychologic:   Patient denies depression and anxiety.   VITAL SIGNS:      03/29/2017 11:11 AM  Weight 170 lb / 77.11 kg  Height 70 in / 177.8 cm  BP 111/64 mmHg  Pulse 90 /min  Temperature 97.4 F / 36.3 C  BMI 24.4 kg/m   MULTI-SYSTEM PHYSICAL EXAMINATION:    Constitutional: Well-nourished. No physical deformities. Normally developed. Good grooming.  Respiratory: Normal breath sounds. No labored breathing, no use of accessory muscles.   Cardiovascular: Regular rate  and rhythm. No murmur, no gallop. Normal temperature, no swelling, no varicosities.   Skin: No paleness, no jaundice, no cyanosis. No lesion, no ulcer, no rash.  Neurologic / Psychiatric: Oriented to time, oriented to place, oriented to person. No depression, no anxiety, no agitation.  Gastrointestinal: No mass, no tenderness, no rigidity, non obese abdomen.  Musculoskeletal: Spine, ribs, pelvis no bilateral tenderness. Normal gait and station of head and neck.     PAST DATA REVIEWED:  Source Of History:  Patient  Lab Test Review:   PSA  Records Review:   Previous Patient Records  Urine Test Review:   Urinalysis   02/02/17 02/07/16 05/08/15 06/14/14 11/25/12 05/25/12 11/23/11 05/11/11  PSA  Total PSA 0.27 ng/mL 0.45 ng/dl 1.42  1.27  0.60  0.66  0.67  0.83     05/11/11  Hormones  Testosterone, Total 335.55     PROCEDURES:          Urinalysis Dipstick Dipstick Cont'd  Color: Yellow Bilirubin: Neg  Appearance: Clear Ketones: Neg  Specific Gravity: 1.020 Blood: Neg  pH: 6.0 Protein: Trace  Glucose: Neg Urobilinogen: 0.2    Nitrites: Neg    Leukocyte Esterase: Neg    ASSESSMENT:      ICD-10 Details  1 GU:   Bladder Cancer Lateral - C67.2   2   ED due to arterial insufficiency - N52.01    PLAN:           Document Letter(s):  Created for Patient: Clinical Summary         Notes:   Urinalysis today is clear. We discussed upcoming procedure in detail and I answered all of  his questions to the best of my ability. He will d/c Aspirin and other vitamins, as instructed. I will plan to have him keep scheduled procedure. Should and questions or concerns arise in the interim, he will let us know.     ** Signed by Azucena Fallen on 03/29/17 at 6:20 PM (EST)**     The information contained in this medical record document is considered private and confidential patient information. This information can only be used for the medical diagnosis and/or medical services that are being provided  by the patient's selected caregivers. This information can only be distributed outside of the patient's care if the patient agrees and signs waivers of authorization for this information to be sent to an outside source or route

## 2017-04-13 ENCOUNTER — Ambulatory Visit (HOSPITAL_COMMUNITY): Payer: Medicare Other | Admitting: Anesthesiology

## 2017-04-13 ENCOUNTER — Encounter (HOSPITAL_COMMUNITY): Payer: Self-pay | Admitting: Certified Registered Nurse Anesthetist

## 2017-04-13 ENCOUNTER — Other Ambulatory Visit: Payer: Self-pay

## 2017-04-13 ENCOUNTER — Encounter (HOSPITAL_COMMUNITY)
Admission: RE | Disposition: A | Discharge status: Home / Self Care | Payer: Self-pay | Source: Ambulatory Visit | Attending: Urology

## 2017-04-13 ENCOUNTER — Observation Stay (HOSPITAL_COMMUNITY)
Admission: RE | Admit: 2017-04-13 | Discharge: 2017-04-14 | Disposition: A | Discharge status: Home / Self Care | Payer: Medicare Other | Source: Ambulatory Visit | Attending: Urology | Admitting: Urology

## 2017-04-13 DIAGNOSIS — K219 Gastro-esophageal reflux disease without esophagitis: Secondary | ICD-10-CM | POA: Insufficient documentation

## 2017-04-13 DIAGNOSIS — Z79899 Other long term (current) drug therapy: Secondary | ICD-10-CM | POA: Diagnosis not present

## 2017-04-13 DIAGNOSIS — N138 Other obstructive and reflux uropathy: Secondary | ICD-10-CM | POA: Insufficient documentation

## 2017-04-13 DIAGNOSIS — N5201 Erectile dysfunction due to arterial insufficiency: Secondary | ICD-10-CM | POA: Diagnosis present

## 2017-04-13 DIAGNOSIS — Z7951 Long term (current) use of inhaled steroids: Secondary | ICD-10-CM | POA: Insufficient documentation

## 2017-04-13 DIAGNOSIS — N529 Male erectile dysfunction, unspecified: Secondary | ICD-10-CM | POA: Diagnosis present

## 2017-04-13 DIAGNOSIS — Z8546 Personal history of malignant neoplasm of prostate: Secondary | ICD-10-CM | POA: Diagnosis not present

## 2017-04-13 DIAGNOSIS — Z923 Personal history of irradiation: Secondary | ICD-10-CM | POA: Diagnosis not present

## 2017-04-13 DIAGNOSIS — Z9221 Personal history of antineoplastic chemotherapy: Secondary | ICD-10-CM | POA: Insufficient documentation

## 2017-04-13 DIAGNOSIS — E7800 Pure hypercholesterolemia, unspecified: Secondary | ICD-10-CM | POA: Insufficient documentation

## 2017-04-13 DIAGNOSIS — N189 Chronic kidney disease, unspecified: Secondary | ICD-10-CM | POA: Insufficient documentation

## 2017-04-13 DIAGNOSIS — N3289 Other specified disorders of bladder: Secondary | ICD-10-CM | POA: Insufficient documentation

## 2017-04-13 DIAGNOSIS — N401 Enlarged prostate with lower urinary tract symptoms: Secondary | ICD-10-CM | POA: Diagnosis not present

## 2017-04-13 DIAGNOSIS — Z9981 Dependence on supplemental oxygen: Secondary | ICD-10-CM | POA: Diagnosis not present

## 2017-04-13 DIAGNOSIS — Z8551 Personal history of malignant neoplasm of bladder: Secondary | ICD-10-CM | POA: Diagnosis not present

## 2017-04-13 DIAGNOSIS — Z7982 Long term (current) use of aspirin: Secondary | ICD-10-CM | POA: Insufficient documentation

## 2017-04-13 DIAGNOSIS — Z87891 Personal history of nicotine dependence: Secondary | ICD-10-CM | POA: Diagnosis not present

## 2017-04-13 DIAGNOSIS — J449 Chronic obstructive pulmonary disease, unspecified: Secondary | ICD-10-CM | POA: Diagnosis not present

## 2017-04-13 HISTORY — PX: PENILE PROSTHESIS IMPLANT: SHX240

## 2017-04-13 HISTORY — PX: CYSTOSCOPY WITH BIOPSY: SHX5122

## 2017-04-13 LAB — GLUCOSE, CAPILLARY
GLUCOSE-CAPILLARY: 133 mg/dL — AB (ref 65–99)
Glucose-Capillary: 172 mg/dL — ABNORMAL HIGH (ref 65–99)

## 2017-04-13 SURGERY — CYSTOSCOPY, WITH BIOPSY
Anesthesia: General

## 2017-04-13 MED ORDER — MOMETASONE FURO-FORMOTEROL FUM 200-5 MCG/ACT IN AERO
2.0000 | INHALATION_SPRAY | Freq: Two times a day (BID) | RESPIRATORY_TRACT | Status: DC
  Administered 2017-04-13 – 2017-04-14 (×2): 2 via RESPIRATORY_TRACT
  Filled 2017-04-13: qty 8.8

## 2017-04-13 MED ORDER — OXYCODONE HCL 5 MG PO TABS: 5.0000 mg | ORAL_TABLET | ORAL | Status: DC | PRN

## 2017-04-13 MED ORDER — CYCLOSPORINE 0.05 % OP EMUL
1.0000 [drp] | Freq: Two times a day (BID) | OPHTHALMIC | Status: DC | PRN
  Filled 2017-04-13: qty 1

## 2017-04-13 MED ORDER — FENTANYL CITRATE (PF) 100 MCG/2ML IJ SOLN
INTRAMUSCULAR | Status: DC | PRN
  Administered 2017-04-13 (×4): 25 ug via INTRAVENOUS

## 2017-04-13 MED ORDER — SULFAMETHOXAZOLE-TRIMETHOPRIM 800-160 MG PO TABS
1.0000 | ORAL_TABLET | Freq: Two times a day (BID) | ORAL | Status: DC
  Administered 2017-04-13 – 2017-04-14 (×2): 1 via ORAL
  Filled 2017-04-13 (×2): qty 1

## 2017-04-13 MED ORDER — STERILE WATER FOR IRRIGATION IR SOLN
Status: DC | PRN
  Administered 2017-04-13: 3000 mL

## 2017-04-13 MED ORDER — PROPOFOL 10 MG/ML IV BOLUS
INTRAVENOUS | Status: DC | PRN
  Administered 2017-04-13: 80 mg via INTRAVENOUS
  Administered 2017-04-13: 40 mg via INTRAVENOUS

## 2017-04-13 MED ORDER — FENTANYL CITRATE (PF) 100 MCG/2ML IJ SOLN
INTRAMUSCULAR | Status: AC
  Filled 2017-04-13: qty 2

## 2017-04-13 MED ORDER — ONDANSETRON HCL 4 MG/2ML IJ SOLN
INTRAMUSCULAR | Status: DC | PRN
  Administered 2017-04-13: 4 mg via INTRAVENOUS

## 2017-04-13 MED ORDER — PHENYLEPHRINE 40 MCG/ML (10ML) SYRINGE FOR IV PUSH (FOR BLOOD PRESSURE SUPPORT)
PREFILLED_SYRINGE | INTRAVENOUS | Status: DC | PRN
  Administered 2017-04-13 (×2): 80 ug via INTRAVENOUS

## 2017-04-13 MED ORDER — MIDAZOLAM HCL 2 MG/2ML IJ SOLN: 0.5000 mg | Freq: Once | INTRAMUSCULAR | Status: DC | PRN

## 2017-04-13 MED ORDER — INSULIN ASPART 100 UNIT/ML ~~LOC~~ SOLN
0.0000 [IU] | Freq: Three times a day (TID) | SUBCUTANEOUS | Status: DC
  Administered 2017-04-13 – 2017-04-14 (×2): 3 [IU] via SUBCUTANEOUS

## 2017-04-13 MED ORDER — PHENYLEPHRINE HCL 10 MG/ML IJ SOLN
INTRAVENOUS | Status: DC | PRN
  Administered 2017-04-13: 50 ug/min via INTRAVENOUS

## 2017-04-13 MED ORDER — SODIUM CHLORIDE 0.9 % IV SOLN
3.0000 g | Freq: Once | INTRAVENOUS | Status: AC
  Administered 2017-04-13: 3 g via INTRAVENOUS
  Filled 2017-04-13: qty 3

## 2017-04-13 MED ORDER — MEPERIDINE HCL 50 MG/ML IJ SOLN: 6.2500 mg | INTRAMUSCULAR | Status: DC | PRN

## 2017-04-13 MED ORDER — SODIUM CHLORIDE 0.9 % IV SOLN
INTRAVENOUS | Status: AC
  Filled 2017-04-13: qty 500000

## 2017-04-13 MED ORDER — PHENYLEPHRINE 40 MCG/ML (10ML) SYRINGE FOR IV PUSH (FOR BLOOD PRESSURE SUPPORT)
PREFILLED_SYRINGE | INTRAVENOUS | Status: AC
  Filled 2017-04-13: qty 10

## 2017-04-13 MED ORDER — 0.9 % SODIUM CHLORIDE (POUR BTL) OPTIME
TOPICAL | Status: DC | PRN
  Administered 2017-04-13: 1000 mL

## 2017-04-13 MED ORDER — FENTANYL CITRATE (PF) 100 MCG/2ML IJ SOLN
25.0000 ug | INTRAMUSCULAR | Status: DC | PRN
  Administered 2017-04-13 (×2): 50 ug via INTRAVENOUS

## 2017-04-13 MED ORDER — PROMETHAZINE HCL 25 MG/ML IJ SOLN: 6.2500 mg | INTRAMUSCULAR | Status: DC | PRN

## 2017-04-13 MED ORDER — DEXAMETHASONE SODIUM PHOSPHATE 10 MG/ML IJ SOLN
INTRAMUSCULAR | Status: DC | PRN
  Administered 2017-04-13: 10 mg via INTRAVENOUS

## 2017-04-13 MED ORDER — DOCUSATE SODIUM 100 MG PO CAPS
100.0000 mg | ORAL_CAPSULE | Freq: Two times a day (BID) | ORAL | Status: DC
  Administered 2017-04-13 – 2017-04-14 (×2): 100 mg via ORAL
  Filled 2017-04-13 (×2): qty 1

## 2017-04-13 MED ORDER — ACETAMINOPHEN 500 MG PO TABS
1000.0000 mg | ORAL_TABLET | Freq: Four times a day (QID) | ORAL | Status: AC
  Administered 2017-04-13 – 2017-04-14 (×4): 1000 mg via ORAL
  Filled 2017-04-13 (×4): qty 2

## 2017-04-13 MED ORDER — ONDANSETRON HCL 4 MG/2ML IJ SOLN
INTRAMUSCULAR | Status: AC
  Filled 2017-04-13: qty 2

## 2017-04-13 MED ORDER — SIMVASTATIN 40 MG PO TABS
40.0000 mg | ORAL_TABLET | Freq: Every evening | ORAL | Status: DC
  Administered 2017-04-13: 40 mg via ORAL
  Filled 2017-04-13: qty 1

## 2017-04-13 MED ORDER — SODIUM CHLORIDE 0.9 % IV SOLN
INTRAVENOUS | Status: DC
  Administered 2017-04-13 – 2017-04-14 (×2): via INTRAVENOUS

## 2017-04-13 MED ORDER — ALBUTEROL SULFATE HFA 108 (90 BASE) MCG/ACT IN AERS: 2.0000 | INHALATION_SPRAY | Freq: Four times a day (QID) | RESPIRATORY_TRACT | Status: DC | PRN

## 2017-04-13 MED ORDER — ALBUTEROL SULFATE (2.5 MG/3ML) 0.083% IN NEBU: 2.5000 mg | INHALATION_SOLUTION | Freq: Four times a day (QID) | RESPIRATORY_TRACT | Status: DC | PRN

## 2017-04-13 MED ORDER — STERILE WATER FOR IRRIGATION IR SOLN
Status: DC | PRN
  Administered 2017-04-13: 500 mL

## 2017-04-13 MED ORDER — SODIUM CHLORIDE 0.9 % IV SOLN
INTRAVENOUS | Status: DC | PRN
  Administered 2017-04-13: 500 mL

## 2017-04-13 MED ORDER — LACTATED RINGERS IV SOLN
INTRAVENOUS | Status: DC | PRN
  Administered 2017-04-13: 07:00:00 via INTRAVENOUS

## 2017-04-13 MED ORDER — LORATADINE 10 MG PO TABS
10.0000 mg | ORAL_TABLET | Freq: Every day | ORAL | Status: DC
  Administered 2017-04-14: 10 mg via ORAL
  Filled 2017-04-13: qty 1

## 2017-04-13 MED ORDER — LIDOCAINE-EPINEPHRINE 1 %-1:100000 IJ SOLN
INTRAMUSCULAR | Status: DC | PRN
  Administered 2017-04-13: 5 mL

## 2017-04-13 MED ORDER — PROPOFOL 10 MG/ML IV BOLUS
INTRAVENOUS | Status: AC
  Filled 2017-04-13: qty 20

## 2017-04-13 MED ORDER — ONDANSETRON HCL 4 MG/2ML IJ SOLN: 4.0000 mg | INTRAMUSCULAR | Status: DC | PRN

## 2017-04-13 MED ORDER — TAMSULOSIN HCL 0.4 MG PO CAPS
0.4000 mg | ORAL_CAPSULE | Freq: Every evening | ORAL | Status: DC
  Administered 2017-04-13: 0.4 mg via ORAL
  Filled 2017-04-13: qty 1

## 2017-04-13 MED ORDER — TIOTROPIUM BROMIDE MONOHYDRATE 18 MCG IN CAPS
18.0000 ug | ORAL_CAPSULE | Freq: Every day | RESPIRATORY_TRACT | Status: DC
  Administered 2017-04-14: 18 ug via RESPIRATORY_TRACT
  Filled 2017-04-13: qty 5

## 2017-04-13 MED ORDER — INSULIN ASPART 100 UNIT/ML ~~LOC~~ SOLN: 0.0000 [IU] | Freq: Every day | SUBCUTANEOUS | Status: DC

## 2017-04-13 MED ORDER — MORPHINE SULFATE (PF) 4 MG/ML IV SOLN: 2.0000 mg | INTRAVENOUS | Status: DC | PRN

## 2017-04-13 MED ORDER — FLUTICASONE PROPIONATE 50 MCG/ACT NA SUSP: 1.0000 | Freq: Every day | NASAL | Status: DC | PRN

## 2017-04-13 MED ORDER — PANTOPRAZOLE SODIUM 40 MG PO TBEC
40.0000 mg | DELAYED_RELEASE_TABLET | Freq: Every day | ORAL | Status: DC
  Administered 2017-04-14: 40 mg via ORAL
  Filled 2017-04-13: qty 1

## 2017-04-13 MED ORDER — LIDOCAINE 2% (20 MG/ML) 5 ML SYRINGE
INTRAMUSCULAR | Status: DC | PRN
  Administered 2017-04-13: 30 mg via INTRAVENOUS

## 2017-04-13 MED ORDER — LIDOCAINE-EPINEPHRINE 1 %-1:100000 IJ SOLN
INTRAMUSCULAR | Status: AC
  Filled 2017-04-13: qty 1

## 2017-04-13 MED ORDER — DEXAMETHASONE SODIUM PHOSPHATE 10 MG/ML IJ SOLN
INTRAMUSCULAR | Status: AC
  Filled 2017-04-13: qty 1

## 2017-04-13 MED ORDER — SENNOSIDES-DOCUSATE SODIUM 8.6-50 MG PO TABS
2.0000 | ORAL_TABLET | Freq: Every day | ORAL | Status: DC
  Filled 2017-04-13: qty 2

## 2017-04-13 MED ORDER — BELLADONNA ALKALOIDS-OPIUM 16.2-60 MG RE SUPP
1.0000 | Freq: Four times a day (QID) | RECTAL | Status: DC | PRN
  Administered 2017-04-13: 1 via RECTAL
  Filled 2017-04-13: qty 1

## 2017-04-13 SURGICAL SUPPLY — 44 items
ADH SKN CLS APL DERMABOND .7 (GAUZE/BANDAGES/DRESSINGS) ×1
BAG URINE DRAINAGE (UROLOGICAL SUPPLIES) ×3 IMPLANT
BAG URO CATCHER STRL LF (MISCELLANEOUS) ×3 IMPLANT
BLADE 11 SAFETY STRL DISP (BLADE) ×2 IMPLANT
BLADE SURG SZ11 CARB STEEL (BLADE) ×3 IMPLANT
BNDG GAUZE ELAST 4 BULKY (GAUZE/BANDAGES/DRESSINGS) ×3 IMPLANT
BRIEF STRETCH FOR OB PAD LRG (UNDERPADS AND DIAPERS) ×3 IMPLANT
CATH FOLEY 2WAY SLVR  5CC 16FR (CATHETERS) ×2
CATH FOLEY 2WAY SLVR 5CC 16FR (CATHETERS) ×1 IMPLANT
CHLORAPREP W/TINT 26ML (MISCELLANEOUS) ×3 IMPLANT
COVER FOOTSWITCH UNIV (MISCELLANEOUS) IMPLANT
COVER MAYO STAND STRL (DRAPES) ×3 IMPLANT
COVER SURGICAL LIGHT HANDLE (MISCELLANEOUS) ×3 IMPLANT
DERMABOND ADVANCED (GAUZE/BANDAGES/DRESSINGS) ×2
DERMABOND ADVANCED .7 DNX12 (GAUZE/BANDAGES/DRESSINGS) ×1 IMPLANT
DRAPE LAPAROTOMY T 98X78 PEDS (DRAPES) ×3 IMPLANT
DRSG KUZMA FLUFF (GAUZE/BANDAGES/DRESSINGS) ×2 IMPLANT
DRSG TELFA 3X8 NADH (GAUZE/BANDAGES/DRESSINGS) ×3 IMPLANT
GLOVE BIOGEL M STRL SZ7.5 (GLOVE) ×9 IMPLANT
GOWN STRL REUS W/TWL XL LVL3 (GOWN DISPOSABLE) ×3 IMPLANT
KIT BASIN OR (CUSTOM PROCEDURE TRAY) ×3 IMPLANT
KIT TITAN ASSEMBLY (Erectile Restoration) ×2 IMPLANT
KIT TITAN ASSEMBLY STANDARD (Erectile Restoration) ×1 IMPLANT
KIT TITAN ASSEMBLY STD (Erectile Restoration) IMPLANT
LOOP CUT BIPOLAR 24F LRG (ELECTROSURGICAL) IMPLANT
MANIFOLD NEPTUNE II (INSTRUMENTS) ×3 IMPLANT
NS IRRIG 1000ML POUR BTL (IV SOLUTION) ×3 IMPLANT
PACK CYSTO (CUSTOM PROCEDURE TRAY) ×3 IMPLANT
PACK GENERAL/GYN (CUSTOM PROCEDURE TRAY) ×3 IMPLANT
PAD DRESSING TELFA 3X8 NADH (GAUZE/BANDAGES/DRESSINGS) IMPLANT
PAD TELFA 2X3 NADH STRL (GAUZE/BANDAGES/DRESSINGS) ×3 IMPLANT
PROS TITAN SCROT 0 ANG 18CM (Erectile Restoration) ×3 IMPLANT
PROSTHESIS TTN SCRO 0 ANG 18CM (Erectile Restoration) IMPLANT
RESERVOIR 75CC LOCKOUT BIOFLEX (Erectile Restoration) ×2 IMPLANT
RETRACTOR WILSON SYSTEM (INSTRUMENTS) ×3 IMPLANT
SUT MNCRL AB 3-0 PS2 18 (SUTURE) ×3 IMPLANT
SUT VIC AB 2-0 SH 27 (SUTURE) ×21
SUT VIC AB 2-0 SH 27X BRD (SUTURE) ×6 IMPLANT
SYR 20CC LL (SYRINGE) ×3 IMPLANT
SYR 50ML LL SCALE MARK (SYRINGE) ×6 IMPLANT
TOWEL OR 17X26 10 PK STRL BLUE (TOWEL DISPOSABLE) ×3 IMPLANT
TUBING CONNECTING 10 (TUBING) ×2 IMPLANT
TUBING CONNECTING 10' (TUBING) ×1
WATER STERILE IRR 1000ML POUR (IV SOLUTION) ×3 IMPLANT

## 2017-04-13 NOTE — Anesthesia Postprocedure Evaluation (Signed)
Anesthesia Post Note  Patient: Fred Newton  Procedure(s) Performed: CYSTOSCOPY WITH BIOPSY OF BLADDER/ FULGURATION (N/A ) PENILE PROTHESIS INFLATABLE (N/A )     Patient location during evaluation: PACU Anesthesia Type: General Level of consciousness: awake and alert, oriented and patient cooperative Pain management: pain level controlled Vital Signs Assessment: post-procedure vital signs reviewed and stable Respiratory status: spontaneous breathing, nonlabored ventilation, respiratory function stable and patient connected to nasal cannula oxygen Cardiovascular status: blood pressure returned to baseline and stable Postop Assessment: no apparent nausea or vomiting Anesthetic complications: no    Last Vitals:  Vitals:   04/13/17 1300 04/13/17 1316  BP: 127/69 136/71  Pulse: 74 86  Resp: 12 12  Temp:  36.4 C  SpO2: 99% 94%    Last Pain:  Vitals:   04/13/17 1300  TempSrc:   PainSc: 2                  [name],E. Kinser Fellman

## 2017-04-13 NOTE — Progress Notes (Signed)
Pt arrived from PACU on stretcher. Slid self to bed. VSS. A&Ox4, Denies pain at present. Dsg to penis c/d/i w/ sling in place. Pt and wife oriented to callbell and environment. POC discussed.

## 2017-04-13 NOTE — Interval H&P Note (Signed)
History and Physical Interval Note:  04/13/2017 7:28 AM  Fred Newton  has presented today for surgery, with the diagnosis of ERECTILE DYSFUNCTION, BLADDER ERYTHEMA  The various methods of treatment have been discussed with the patient and family. After consideration of risks, benefits and other options for treatment, the patient has consented to  Procedure(s): CYSTOSCOPY WITH BIOPSY OF BLADDER/ FULGURATION (N/A) PENILE PROTHESIS INFLATABLE (N/A) as a surgical intervention. The patient's history has been reviewed, patient examined, no change in status, stable for surgery.  I have reviewed the patient's chart and labs. We discussed the procedure and anatomy again. I showed him where the scrotal incision and reservoir (right inguinal) would be tucked. Normal exam. Questions were answered to the patient's satisfaction.     Festus Aloe

## 2017-04-13 NOTE — Anesthesia Procedure Notes (Signed)
Procedure Name: LMA Insertion Date/Time: 04/13/2017 7:52 AM Performed by: [place] Jacquelin Krajewski C, CRNA Pre-anesthesia Checklist: Patient identified, Emergency Drugs available, Suction available and Patient being monitored Patient Re-evaluated:Patient Re-evaluated prior to induction Oxygen Delivery Method: Circle system utilized Preoxygenation: Pre-oxygenation with 100% oxygen Induction Type: IV induction Ventilation: Mask ventilation without difficulty LMA: LMA inserted LMA Size: 4.0 Number of attempts: 1 Airway Equipment and Method: Bite block Placement Confirmation: positive ETCO2 Tube secured with: Tape Dental Injury: Teeth and Oropharynx as per pre-operative assessment

## 2017-04-13 NOTE — Op Note (Signed)
Preoperative diagnosis: History of bladder cancer, bladder erythema, erectile dysfunction Postoperative diagnosis: Same  Procedure: Cystoscopy, fulguration of lesion less than  0.5 cm; insertion of Titan Coloplast inflatable penile prosthesis (18 cm, no RTE) with 75 cc reservoir/left inguinal approach into the space of Retzius (filled with 60 cc)  Surgeon: Junious Silk  Resident Surgeon: Shanon Brow  Anesthesia: General  Indication for procedure: 84 year old white male with a history of right T2 bladder cancer status post chemo and radiation with some erythema noted on recent cystoscopy in the office.  Also he considered inflatable penile prosthesis for several years and elected to proceed.  Findings: On cystoscopy the urethra and prostatic urethra were unremarkable.  Trigone and ureteral orifice ease in the normal orthotopic position with clear reflux.  There was no stone or foreign body in the bladder.  The mucosa of the bladder appeared normal.  Around the right bladder wall there was some faint neovascularity but no significant confluent areas of erythema, no patches, no papillary lesions.  Some of the neovascularity was lightly fulgurated.  Description of procedure: After consent was obtained the patient brought to the operating room.  After adequate anesthesia he was placed in lithotomy position and prepped and draped in the usual sterile fashion.  The cystoscope was passed per urethra and the bladder carefully inspected.  We were able to see anteriorly in the bladder neck without difficulty.  There were no worrisome lesions.  It appears he has some neovascularity likely from radiation.  We lightly fulgurated 2 of these areas.  They were about 5 mm each.  Given these were very superficial areas and simple appearing, I did not feel he needed a biopsy.  The bladder was drained and the scope removed.  He was then placed supine and the penis and external genitalia scrubbed with Betadine for 10 minutes.   This was cleaned and he was prepped with ChloraPrep and draped in the usual sterile fashion after the appropriate waiting time.  A Foley catheter was placed per urethra and left to gravity drainage.  The ring retractor was placed and the sharp look into the penis and the penis retracted.  A transverse scrotal incision was made after the dartos fascia was dissected off the corpora bilaterally.  The urethra was noted and 2-0 Vicryl stay sutures were placed x2 in each corpora and corporotomies made in between.  He was dilated from 8-11 without difficulty.  2 dilators were placed distally to confirm no crossover and proximally to confirm proper depth.  Depth was equal.  The corpora were irrigated.  Irrigation through one corpora effluxed through the contralateral corporotomy and the meatus/urethra remained dry.  We then measured and noted an 8 cm proximal length and 10.5 cm distal length on the right, 8 cm proximal length and 10 cm distal length on the left therefore we went with an 18 cm prosthesis without rear-tip extenders.  Bladder drainage was ensured.  As the prosthesis was prepared the left external ring was palpated and transversalis fascia punctured with the Metzenbaums.  Using blunt finger dissection the space of Retzius was dissected.  Gloves were changed and a new sterile drape placed. A 75 cc reservoir was selected and placed in the left space of Retzius and filled with 60 cc.  It was inflated and noted to sit appropriately without back pressure.  The prosthesis was then brought on the field and the [facility] inserter used to deliver the right than the left cylinder through the glans.  The sharp hook  was removed.  The prosthesis was then placed and inflated which provided an excellent erection.  We added an extra 2-0 Vicryl to the left corporotomy to ensure its closure and then converted the stay sutures in a horizontal mattress and these were tied down.  The pump was then placed down in the scrotum and  secured with a 2-0 Vicryl suture on some of the deep fascia securing it in place.  Irrigation was performed copiously throughout the case.  The pump and reservoir were then connected.  The device was cycled and cycled normally.  I left at about 60% inflated. Erection was straight and excellent. Cylinders reached to the tip of each corpora and the glans was well supported.  Hemostasis was excellent.  I did not leave a drain.  Final irrigation done and the dartos fascia was run closed over the tubing.  The skin was then closed with a running horizontal chromic suture.  Telfa, fluffs and a jock strap were placed for compression and he was awakened and taken to recovery room in stable condition.  Estimated blood loss: 25 mL  Complications: None  Drains: 16 French Foley catheter  Specimens: None  Disposition: Patient stable to PACU

## 2017-04-13 NOTE — Transfer of Care (Signed)
Immediate Anesthesia Transfer of Care Note  Patient: Fred Newton  Procedure(s) Performed: CYSTOSCOPY WITH BIOPSY OF BLADDER/ FULGURATION (N/A ) PENILE PROTHESIS INFLATABLE (N/A )  Patient Location: PACU  Anesthesia Type:General  Level of Consciousness: awake, alert  and oriented  Airway & Oxygen Therapy: Patient Spontanous Breathing and Patient connected to face mask oxygen  Post-op Assessment: Report given to RN and Post -op Vital signs reviewed and stable  Post vital signs: Reviewed and stable  Last Vitals:  Vitals:   04/13/17 0538  BP: 129/74  Pulse: 94  Resp: 18  Temp: 36.7 C  SpO2: 95%    Last Pain:  Vitals:   04/13/17 0538  TempSrc: Oral      Patients Stated Pain Goal: 3 (55/37/48 2707)  Complications: No apparent anesthesia complications

## 2017-04-13 NOTE — Plan of Care (Signed)
  Progressing Clinical Measurements: Postoperative complications will be avoided or minimized 04/13/2017 2311 - Progressing by Talbert Forest, RN Skin Integrity: Demonstration of wound healing without infection will improve 04/13/2017 2311 - Progressing by Talbert Forest, RN Education: Knowledge of General Education information will improve 04/13/2017 2311 - Progressing by Talbert Forest, RN Health Behavior/Discharge Planning: Ability to manage health-related needs will improve 04/13/2017 2311 - Progressing by Talbert Forest, RN Clinical Measurements: Ability to maintain clinical measurements within normal limits will improve 04/13/2017 2311 - Progressing by Talbert Forest, RN Will remain free from infection 04/13/2017 2311 - Progressing by Talbert Forest, RN Diagnostic test results will improve 04/13/2017 2311 - Progressing by Talbert Forest, RN Respiratory complications will improve 04/13/2017 2311 - Progressing by Talbert Forest, RN Cardiovascular complication will be avoided 04/13/2017 2311 - Progressing by Talbert Forest, RN Activity: Risk for activity intolerance will decrease 04/13/2017 2311 - Progressing by Talbert Forest, RN Nutrition: Adequate nutrition will be maintained 04/13/2017 2311 - Progressing by Talbert Forest, RN Coping: Level of anxiety will decrease 04/13/2017 2311 - Progressing by Talbert Forest, RN Elimination: Will not experience complications related to bowel motility 04/13/2017 2311 - Progressing by Talbert Forest, RN Will not experience complications related to urinary retention 04/13/2017 2311 - Progressing by Talbert Forest, RN Pain Managment: General experience of comfort will improve 04/13/2017 2311 - Progressing by Talbert Forest, RN Safety: Ability to remain free from injury will improve 04/13/2017 2311 - Progressing by Talbert Forest, RN Skin Integrity: Risk  for impaired skin integrity will decrease 04/13/2017 2311 - Progressing by Talbert Forest, RN

## 2017-04-14 DIAGNOSIS — N5201 Erectile dysfunction due to arterial insufficiency: Secondary | ICD-10-CM | POA: Diagnosis not present

## 2017-04-14 LAB — GLUCOSE, CAPILLARY
GLUCOSE-CAPILLARY: 166 mg/dL — AB (ref 65–99)
GLUCOSE-CAPILLARY: 96 mg/dL (ref 65–99)

## 2017-04-14 MED ORDER — FISH OIL 1000 MG PO CAPS: 1000.0000 mg | ORAL_CAPSULE | Freq: Two times a day (BID) | ORAL | 0 refills | Status: AC

## 2017-04-14 MED ORDER — OXYCODONE-ACETAMINOPHEN 5-325 MG PO TABS
1.0000 | ORAL_TABLET | ORAL | 0 refills | Status: AC | PRN
Start: 2017-04-14 — End: ?

## 2017-04-14 MED ORDER — SULFAMETHOXAZOLE-TRIMETHOPRIM 800-160 MG PO TABS: 1.0000 | ORAL_TABLET | Freq: Two times a day (BID) | ORAL | 0 refills | Status: AC

## 2017-04-14 MED ORDER — DOCUSATE SODIUM 100 MG PO CAPS: 100.0000 mg | ORAL_CAPSULE | Freq: Two times a day (BID) | ORAL | 0 refills | Status: AC

## 2017-04-14 MED ORDER — ASPIRIN EC 81 MG PO TBEC: 81.0000 mg | DELAYED_RELEASE_TABLET | Freq: Every morning | ORAL | Status: AC

## 2017-04-14 NOTE — Discharge Summary (Signed)
[facility] Discharge Summary  Admit date: 04/13/2017  Discharge date and time: 04/14/17   Discharge to: Home  Discharge Service: Urology   Discharge Attending Physician:  Dr. Festus Aloe  Discharge  Diagnoses: Erectile dysfunction  Secondary Diagnosis: Active Problems:   Erectile dysfunction   OR Procedures: Procedure(s): CYSTOSCOPY WITH BIOPSY OF BLADDER/ FULGURATION PENILE PROTHESIS INFLATABLE 04/13/2017   Ancillary Procedures: None   Discharge Day Services: The patient was seen and examined by the Urology team both in the morning and immediately prior to discharge.  Vital signs and laboratory values were stable and within normal limits.  The physical exam was benign and unchanged and all surgical wounds were examined.  Discharge instructions were explained and all questions answered.  Subjective  No acute events overnight. Pain Controlled. No fever or chills.  Objective No data found. Total I/O In: 581.3 [P.O.:360; I.V.:221.3] Out: 200 [Urine:200]  General Appearance:        No acute distress Lungs:                 Normal work of breathing on room air Heart:                             Regular rate and rhythm Abdomen:                       Soft, non-tender, non-distended GU   Scrotum with significant ecchymosis, no swelling or induration. IPP maximally deflated. Scrotal support in place Extremities:                  Warm and well perfused    Hospital Course:  The patient underwent IPP placement on 04/13/2017.  The patient tolerated the procedure well, was extubated in the OR, and afterwards was taken to the PACU for routine post-surgical care. When stable the patient was transferred to the floor.   The patient did well postoperatively.  The patient's diet was slowly advanced and at the time of discharge was tolerating a regular diet. Foley catheter was removed on POD1, and patient voided spontaneously. The patient was discharged home 1 Day Post-Op, at which  point was tolerating a regular solid diet, was able to void spontaneously, have adequate pain control with P.O. pain medication, and could ambulate without difficulty. The patient will follow up with [place] for post op check.   Condition at Discharge: Improved  Discharge Medications:  Allergies as of 04/14/2017      Reactions   Hibiclens [chlorhexidine] Itching, Rash, Other (See Comments)   Patient stated,"it burns my skin."   Chlorhexidine Gluconate Itching   Excessive skin dryness and skin redness, pt wishes not to use   Nitrofuran Derivatives Other (See Comments)   Unknown      Medication List    TAKE these medications   albuterol 108 (90 Base) MCG/ACT inhaler Commonly known as:  PROVENTIL HFA;VENTOLIN HFA Inhale 2 puffs into the lungs every 6 (six) hours as needed for wheezing or shortness of breath.   aspirin EC 81 MG tablet Take 1 tablet (81 mg total) by mouth every morning. Start taking on:  04/21/2017 What changed:  These instructions start on 04/21/2017. If you are unsure what to do until then, ask your doctor or other care provider.   CALCIUM 600+D 600-400 MG-UNIT tablet Generic drug:  Calcium Carbonate-Vitamin D Take 1 tablet by mouth daily.   camphor-menthol lotion Commonly known as:  [employer]  Apply 1 application topically 2 (two) times daily as needed for itching.   cetirizine 10 MG tablet Commonly known as:  ZYRTEC Take 10 mg by mouth every morning.   cycloSPORINE 0.05 % ophthalmic emulsion Commonly known as:  RESTASIS Place 1 drop into both eyes 2 (two) times daily as needed (Dry eyes).   docusate sodium 100 MG capsule Commonly known as:  COLACE Take 1 capsule (100 mg total) by mouth 2 (two) times daily.   Fish Oil 1000 MG Caps Take 1 capsule (1,000 mg total) by mouth 2 (two) times daily. Start taking on:  04/21/2017 What changed:  These instructions start on 04/21/2017. If you are unsure what to do until then, ask your doctor or other care provider.    fludrocortisone 0.1 MG tablet Commonly known as:  FLORINEF Take 0.1 mg by mouth every other day.   fluticasone 50 MCG/ACT nasal spray Commonly known as:  FLONASE Place 1 spray into both nostrils daily as needed for allergies or rhinitis.   Fluticasone-Salmeterol 250-50 MCG/DOSE Aepb Commonly known as:  ADVAIR Inhale 1 puff into the lungs 2 (two) times daily.   METAMUCIL Wafr Take 2 Wafers by mouth daily.   multivitamin with minerals Tabs tablet Take 1 tablet by mouth every morning.   oxyCODONE-acetaminophen 5-325 MG tablet Commonly known as:  PERCOCET Take 1-2 tablets by mouth every 4 (four) hours as needed for severe pain.   OXYGEN Inhale into the lungs. 2 LITERS AT NIGHT   pantoprazole 40 MG tablet Commonly known as:  PROTONIX Take 40 mg by mouth daily.   simvastatin 40 MG tablet Commonly known as:  ZOCOR Take 40 mg by mouth every evening.   sulfamethoxazole-trimethoprim 800-160 MG tablet Commonly known as:  BACTRIM DS,SEPTRA DS Take 1 tablet by mouth every 12 (twelve) hours for 7 days.   tamsulosin 0.4 MG Caps capsule Commonly known as:  FLOMAX Take 0.4 mg by mouth every evening.   tiotropium 18 MCG inhalation capsule Commonly known as:  SPIRIVA Place 18 mcg into inhaler and inhale daily.       Pending Test Results: None  Discharge Instructions:  Discharge Instructions    Call MD for:  persistant nausea and vomiting   Complete by:  As directed    Call MD for:  redness, tenderness, or signs of infection (pain, swelling, redness, odor or green/yellow discharge around incision site)   Complete by:  As directed    Call MD for:  severe uncontrolled pain   Complete by:  As directed    Call MD for:  temperature >100.4   Complete by:  As directed    Diet general   Complete by:  As directed    Discharge instructions   Complete by:  As directed    Scrotal surgery postoperative instructions  Wound:  In most cases your incision will have absorbable  sutures that will dissolve within the first 10-20 days. Some will fall out even earlier. Expect some redness as the sutures dissolved but this should occur only around the sutures. If there is generalized redness, especially with increasing pain or swelling, let us know. The scrotum will very likely get "black and blue" as the blood in the tissues spread. Sometimes the whole scrotum will turn colors. The black and blue is followed by a yellow and brown color. In time, all the discoloration will go away. In some cases some firm swelling in the area of the testicle may persist for up to 4-6 weeks after  the surgery and is considered normal in most cases.  Diet:  You may return to your normal diet within 24 hours following your surgery. You may note some mild nausea and possibly vomiting the first 6-8 hours following surgery. This is usually due to the side effects of anesthesia, and will disappear quite soon. I would suggest clear liquids and a very light meal the first evening following your surgery.  Activity:  Your physical activity should be restricted the first 48 hours. During that time you should remain relatively inactive, moving about only when necessary. During the first 7-10 days following surgery he should avoid lifting any heavy objects (anything greater than 15 pounds), and avoid strenuous exercise. If you work, ask [place] specifically about your restrictions, both for work and home. We will write a note to your employer if needed.  You should plan to wear a tight pair of jockey shorts or an athletic supporter for the first 4-5 days, even to sleep. This will keep the scrotum immobilized to some degree and keep the swelling down.  Ice packs should be placed on and off over the scrotum for the first 48 hours. Frozen peas or corn in a ZipLock bag can be frozen, used and re-frozen. Fifteen minutes on and 15 minutes off is a reasonable schedule. The ice is a good pain reliever and keeps the  swelling down.  Hygiene:  You may shower 48 hours after your surgery. Tub bathing should be restricted until the seventh day.   Medication:  You will be sent home with some type of pain medication. In many cases you will be sent home with a narcotic pain pill (hydrococone or oxycodone). If the pain is not too bad, you may take either Tylenol (acetaminophen) or Advil (ibuprofen) which contain no narcotic agents, and might be tolerated a little better, with fewer side effects. If the pain medication you are sent home with does not control the pain, you will have to let us know. Some narcotic pain medications cannot be given or refilled by a phone call to a pharmacy.  Problems you should report to [place]:  Fever of 101.0 degrees Fahrenheit or greater. Moderate or severe swelling under the skin incision or involving the scrotum. Drug reaction such as hives, a rash, nausea or vomiting.  We will activate your pump in 4-6 weeks. We will schedule you for a post op visit in 1-2 weeks. Our office will call you with the details of your appointment. If you don't hear from [place] in the next 2-3 days, please call our office.     Remove dressing in 48 hours   Complete by:  As directed      \

## 2017-04-14 NOTE — Progress Notes (Signed)
D/C instructions reviewed w/ pt and wife. Both verbalize understanding and all questions answered. Pt d/c in w/c in stable condition by NT to wife's car. Pt in possession of d/cpacket, scripts, and all personal belongings.

## 2017-04-14 NOTE — Plan of Care (Signed)
  Completed/Met Clinical Measurements: Postoperative complications will be avoided or minimized 04/14/2017 1556 - Completed/Met by Kerrin Mo, RN Skin Integrity: Demonstration of wound healing without infection will improve 04/14/2017 1556 - Completed/Met by Kerrin Mo, RN Education: Knowledge of General Education information will improve 04/14/2017 1556 - Completed/Met by Kerrin Mo, RN Health Behavior/Discharge Planning: Ability to manage health-related needs will improve 04/14/2017 1556 - Completed/Met by Kerrin Mo, RN Clinical Measurements: Ability to maintain clinical measurements within normal limits will improve 04/14/2017 1556 - Completed/Met by Kerrin Mo, RN Will remain free from infection 04/14/2017 1556 - Completed/Met by Kerrin Mo, RN Diagnostic test results will improve 04/14/2017 1556 - Completed/Met by Kerrin Mo, RN Respiratory complications will improve 04/14/2017 1556 - Completed/Met by Kerrin Mo, RN Cardiovascular complication will be avoided 04/14/2017 1556 - Completed/Met by Kerrin Mo, RN Activity: Risk for activity intolerance will decrease 04/14/2017 1556 - Completed/Met by Kerrin Mo, RN Nutrition: Adequate nutrition will be maintained 04/14/2017 1556 - Completed/Met by Kerrin Mo, RN Coping: Level of anxiety will decrease 04/14/2017 1556 - Completed/Met by Kerrin Mo, RN Elimination: Will not experience complications related to bowel motility 04/14/2017 1556 - Completed/Met by Kerrin Mo, RN Will not experience complications related to urinary retention 04/14/2017 1556 - Completed/Met by Kerrin Mo, RN Pain Managment: General experience of comfort will improve 04/14/2017 1556 - Completed/Met by Kerrin Mo, RN Safety: Ability to remain free from injury will improve 04/14/2017 1556 - Completed/Met by Kerrin Mo, RN Skin  Integrity: Risk for impaired skin integrity will decrease 04/14/2017 1556 - Completed/Met by Kerrin Mo, RN

## 2017-05-25 ENCOUNTER — Encounter: Payer: Self-pay | Admitting: *Deleted

## 2017-08-10 ENCOUNTER — Ambulatory Visit (HOSPITAL_COMMUNITY)
Admission: RE | Admit: 2017-08-10 | Discharge: 2017-08-10 | Disposition: A | Discharge status: Home / Self Care | Payer: Medicare Other | Source: Ambulatory Visit | Attending: Oncology | Admitting: Oncology

## 2017-08-10 ENCOUNTER — Inpatient Hospital Stay: Payer: Medicare Other | Attending: Oncology

## 2017-08-10 DIAGNOSIS — K861 Other chronic pancreatitis: Secondary | ICD-10-CM | POA: Insufficient documentation

## 2017-08-10 DIAGNOSIS — I700 Atherosclerosis of aorta: Secondary | ICD-10-CM | POA: Insufficient documentation

## 2017-08-10 DIAGNOSIS — Z923 Personal history of irradiation: Secondary | ICD-10-CM | POA: Diagnosis not present

## 2017-08-10 DIAGNOSIS — Z9079 Acquired absence of other genital organ(s): Secondary | ICD-10-CM | POA: Diagnosis not present

## 2017-08-10 DIAGNOSIS — C678 Malignant neoplasm of overlapping sites of bladder: Secondary | ICD-10-CM | POA: Diagnosis not present

## 2017-08-10 DIAGNOSIS — J439 Emphysema, unspecified: Secondary | ICD-10-CM | POA: Insufficient documentation

## 2017-08-10 DIAGNOSIS — N289 Disorder of kidney and ureter, unspecified: Secondary | ICD-10-CM | POA: Insufficient documentation

## 2017-08-10 DIAGNOSIS — I2510 Atherosclerotic heart disease of native coronary artery without angina pectoris: Secondary | ICD-10-CM | POA: Insufficient documentation

## 2017-08-10 DIAGNOSIS — Z7982 Long term (current) use of aspirin: Secondary | ICD-10-CM | POA: Diagnosis not present

## 2017-08-10 DIAGNOSIS — Z79899 Other long term (current) drug therapy: Secondary | ICD-10-CM | POA: Diagnosis not present

## 2017-08-10 DIAGNOSIS — D649 Anemia, unspecified: Secondary | ICD-10-CM | POA: Insufficient documentation

## 2017-08-10 DIAGNOSIS — Z9221 Personal history of antineoplastic chemotherapy: Secondary | ICD-10-CM | POA: Insufficient documentation

## 2017-08-10 LAB — COMPREHENSIVE METABOLIC PANEL
ALBUMIN: 3.6 g/dL (ref 3.5–5.0)
ALK PHOS: 45 U/L (ref 40–150)
ALT: 10 U/L (ref 0–55)
AST: 18 U/L (ref 5–34)
Anion gap: 8 (ref 3–11)
BILIRUBIN TOTAL: 0.3 mg/dL (ref 0.2–1.2)
BUN: 35 mg/dL — AB (ref 7–26)
CO2: 29 mmol/L (ref 22–29)
Calcium: 9.4 mg/dL (ref 8.4–10.4)
Chloride: 103 mmol/L (ref 98–109)
Creatinine, Ser: 1.37 mg/dL — ABNORMAL HIGH (ref 0.70–1.30)
GFR calc Af Amer: 52 mL/min — ABNORMAL LOW (ref >60)
GFR calc non Af Amer: 45 mL/min — ABNORMAL LOW (ref >60)
GLUCOSE: 133 mg/dL (ref 70–140)
Potassium: 4.3 mmol/L (ref 3.5–5.1)
SODIUM: 140 mmol/L (ref 136–145)
TOTAL PROTEIN: 6.3 g/dL — AB (ref 6.4–8.3)

## 2017-08-10 LAB — CBC WITH DIFFERENTIAL/PLATELET
BASOS ABS: 0 10*3/uL (ref 0.0–0.1)
BASOS PCT: 0 %
EOS ABS: 0 10*3/uL (ref 0.0–0.5)
Eosinophils Relative: 0 %
HEMATOCRIT: 29.3 % — AB (ref 38.4–49.9)
HEMOGLOBIN: 9.7 g/dL — AB (ref 13.0–17.1)
Lymphocytes Relative: 22 %
Lymphs Abs: 1.5 10*3/uL (ref 0.9–3.3)
MCH: 32.1 pg (ref 27.2–33.4)
MCHC: 33.1 g/dL (ref 32.0–36.0)
MCV: 97 fL (ref 79.3–98.0)
Monocytes Absolute: 0.6 10*3/uL (ref 0.1–0.9)
Monocytes Relative: 8 %
NEUTROS ABS: 4.8 10*3/uL (ref 1.5–6.5)
NEUTROS PCT: 70 %
Platelets: 309 10*3/uL (ref 140–400)
RBC: 3.02 MIL/uL — ABNORMAL LOW (ref 4.20–5.82)
RDW: 14.3 % (ref 11.0–14.6)
WBC: 6.9 10*3/uL (ref 4.0–10.3)

## 2017-08-10 MED ORDER — IOPAMIDOL (ISOVUE-300) INJECTION 61%
INTRAVENOUS | Status: AC
  Filled 2017-08-10: qty 100

## 2017-08-10 MED ORDER — IOPAMIDOL (ISOVUE-300) INJECTION 61%
100.0000 mL | Freq: Once | INTRAVENOUS | Status: AC | PRN
  Administered 2017-08-10: 100 mL via INTRAVENOUS

## 2017-08-11 ENCOUNTER — Telehealth: Payer: Self-pay | Admitting: Oncology

## 2017-08-11 ENCOUNTER — Inpatient Hospital Stay (HOSPITAL_BASED_OUTPATIENT_CLINIC_OR_DEPARTMENT_OTHER): Payer: Medicare Other | Admitting: Oncology

## 2017-08-11 VITALS — BP 106/57 | HR 85 | Temp 98.4°F | Resp 18 | Ht 73.0 in | Wt 162.9 lb

## 2017-08-11 DIAGNOSIS — Z923 Personal history of irradiation: Secondary | ICD-10-CM

## 2017-08-11 DIAGNOSIS — K861 Other chronic pancreatitis: Secondary | ICD-10-CM

## 2017-08-11 DIAGNOSIS — D649 Anemia, unspecified: Secondary | ICD-10-CM | POA: Diagnosis not present

## 2017-08-11 DIAGNOSIS — I2510 Atherosclerotic heart disease of native coronary artery without angina pectoris: Secondary | ICD-10-CM

## 2017-08-11 DIAGNOSIS — Z9079 Acquired absence of other genital organ(s): Secondary | ICD-10-CM | POA: Diagnosis not present

## 2017-08-11 DIAGNOSIS — C678 Malignant neoplasm of overlapping sites of bladder: Secondary | ICD-10-CM

## 2017-08-11 DIAGNOSIS — Z79899 Other long term (current) drug therapy: Secondary | ICD-10-CM

## 2017-08-11 DIAGNOSIS — J439 Emphysema, unspecified: Secondary | ICD-10-CM

## 2017-08-11 DIAGNOSIS — N289 Disorder of kidney and ureter, unspecified: Secondary | ICD-10-CM

## 2017-08-11 DIAGNOSIS — Z7982 Long term (current) use of aspirin: Secondary | ICD-10-CM

## 2017-08-11 DIAGNOSIS — Z9221 Personal history of antineoplastic chemotherapy: Secondary | ICD-10-CM

## 2017-08-11 DIAGNOSIS — I700 Atherosclerosis of aorta: Secondary | ICD-10-CM | POA: Diagnosis not present

## 2017-08-11 NOTE — Progress Notes (Signed)
Hematology and Oncology Follow Up Visit  Fred Newton [id number] [birth year] 84 y.o. 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    Prior Therapy: He is S/P TURBT in March 2017 with pathology showed high-grade invasive urothelial carcinoma with invasion into the muscularis propria. Definitive radiation therapy with weekly carboplatin started on 07/18/2015. He status post 6 weeks of therapy concluded on 08/22/2015.  Current therapy: Active surveillance.  Interim History: Mr. Fred Newton is here for a follow-up.  Since last visit, he reports recent increase in right-sided hip and knee pain.  He has been evaluated extensively including MRI of his hip as well as plain film x-rays which did not show any acute abnormalities or fractures.  He is still ambulating without any difficulties and is able to drive.  He reports no pain after taking a Percocet today.  He denies any falls or trauma.  He denies any other changes in his performance status or activity level.  He is scheduled to have a bone scan in the near future.  He he denied any headaches, blurry vision, syncope or seizures.  He does not report any alteration of mental status or changes in his mood.  He does not report any fevers, chills or sweats.  He denies any chest pain, palpitation or leg edema.  He does not report any cough, wheezing or hemoptysis. Does not report any nausea, vomiting, abdominal pain, hematochezia or melena.  He denies any change in his bowel habits.  He does not report any frequency, urgency or hesitancy. Does not report any bone pain or pathological fractures.  He denies any lymphadenopathy or petechiae.  Remaining review of systems is negative.  Medications: I have reviewed the patient's current medications.  Current Outpatient Medications  Medication Sig Dispense Refill  . albuterol (PROVENTIL  HFA;VENTOLIN HFA) 108 (90 BASE) MCG/ACT inhaler Inhale 2 puffs into the lungs every 6 (six) hours as needed for wheezing or shortness of breath.    Marland Kitchen aspirin EC 81 MG tablet Take 1 tablet (81 mg total) by mouth every morning.    . Calcium Carbonate-Vitamin D (CALCIUM 600+D) 600-400 MG-UNIT tablet Take 1 tablet by mouth daily.    . camphor-menthol (SARNA) lotion Apply 1 application topically 2 (two) times daily as needed for itching.    . cetirizine (ZYRTEC) 10 MG tablet Take 10 mg by mouth every morning.     . cycloSPORINE (RESTASIS) 0.05 % ophthalmic emulsion Place 1 drop into both eyes 2 (two) times daily as needed (Dry eyes).     Marland Kitchen docusate sodium (COLACE) 100 MG capsule Take 1 capsule (100 mg total) by mouth 2 (two) times daily. 30 capsule 0  . fludrocortisone (FLORINEF) 0.1 MG tablet Take 0.1 mg by mouth every other day.    . fluticasone (FLONASE) 50 MCG/ACT nasal spray Place 1 spray into both nostrils daily as needed for allergies or rhinitis.    . Fluticasone-Salmeterol (ADVAIR) 250-50 MCG/DOSE AEPB Inhale 1 puff into the lungs 2 (two) times daily.    . Multiple Vitamin (MULTIVITAMIN WITH MINERALS) TABS tablet Take 1 tablet by mouth every morning.     . Omega-3 Fatty Acids (FISH OIL) 1000 MG CAPS Take 1 capsule (1,000 mg total) by mouth 2 (two) times daily.  0  . oxyCODONE-acetaminophen (PERCOCET) 5-325 MG tablet Take 1-2 tablets by mouth every 4 (four) hours as needed for severe pain. 20 tablet  0  . OXYGEN Inhale into the lungs. 2 LITERS AT NIGHT    . pantoprazole (PROTONIX) 40 MG tablet Take 40 mg by mouth daily.    . Psyllium (METAMUCIL) WAFR Take 2 Wafers by mouth daily.     . simvastatin (ZOCOR) 40 MG tablet Take 40 mg by mouth every evening.     . tamsulosin (FLOMAX) 0.4 MG CAPS capsule Take 0.4 mg by mouth every evening.     . tiotropium (SPIRIVA) 18 MCG inhalation capsule Place 18 mcg into inhaler and inhale daily.     No current facility-administered medications for this visit.     Facility-Administered Medications Ordered in Other Visits  Medication Dose Route Frequency Provider Last Rate Last Dose  . epirubicin (ELLENCE) 50 mg in sodium chloride 0.9 % bladder instillation  50 mg Bladder Instillation Once Festus Aloe, MD      . epirubicin (ELLENCE) 80 mg in sodium chloride 0.9 % bladder instillation  80 mg Bladder Instillation Once Festus Aloe, MD         Allergies:  Allergies  Allergen Reactions  . Hibiclens [Chlorhexidine] Itching, Rash and Other (See Comments)    Patient stated,"it burns my skin."  . Chlorhexidine Gluconate Itching    Excessive skin dryness and skin redness, pt wishes not to use  . Nitrofuran Derivatives Other (See Comments)    Unknown    Past Medical History, Surgical history, Social history, and Family History were reviewed and updated.    Physical Exam: Blood pressure (!) 106/57, pulse 85, temperature 98.4 F (36.9 C), temperature source Oral, resp. rate 18, height 6\' 1"  (1.854 m), weight 162 lb 14.4 oz (73.9 kg), SpO2 93 %.   ECOG: 0 General appearance: Well-appearing gentleman without distress. Head: Atraumatic without abnormalities. Oropharynx without any thrush or ulcers. Eyes: No scleral icterus. Lymph nodes: No lymphadenopathy noted in the cervical, supraclavicular, or axillary nodes. Heart: Regular rate and rhythm without any murmurs or gallops. Lung: Clear to auscultation without any rhonchi, wheezes or dullness to percussion. Abdomin: Soft, nontender without any rebound or guarding. Musculoskeletal: No joint deformity or effusion. Neurological: Intact motor and sensory exam.   Lab Results: Lab Results  Component Value Date   WBC 6.9 08/10/2017   HGB 9.7 (L) 08/10/2017   HCT 29.3 (L) 08/10/2017   MCV 97.0 08/10/2017   PLT 309 08/10/2017     Chemistry      Component Value Date/Time   NA 140 08/10/2017 1258   NA 139 12/10/2016 1308   K 4.3 08/10/2017 1258   K 4.8 12/10/2016 1308   CL 103  08/10/2017 1258   CO2 29 08/10/2017 1258   CO2 25 12/10/2016 1308   BUN 35 (H) 08/10/2017 1258   BUN 31.0 (H) 12/10/2016 1308   CREATININE 1.37 (H) 08/10/2017 1258   CREATININE 1.4 (H) 12/10/2016 1308      Component Value Date/Time   CALCIUM 9.4 08/10/2017 1258   CALCIUM 9.5 12/10/2016 1308   ALKPHOS 45 08/10/2017 1258   ALKPHOS 52 12/10/2016 1308   AST 18 08/10/2017 1258   AST 25 12/10/2016 1308   ALT 10 08/10/2017 1258   ALT 16 12/10/2016 1308   BILITOT 0.3 08/10/2017 1258   BILITOT 0.48 12/10/2016 1308      EXAM: CT CHEST, ABDOMEN, AND PELVIS WITH CONTRAST  TECHNIQUE: Multidetector CT imaging of the chest, abdomen and pelvis was performed following the standard protocol during bolus administration of intravenous contrast.  CONTRAST:  115mL ISOVUE-300 IOPAMIDOL (ISOVUE-300) INJECTION  61%  COMPARISON:  CT abdomen pelvis 12/10/2016 and CT chest 06/11/2016.  FINDINGS: CT CHEST FINDINGS  Cardiovascular: Atherosclerotic calcification of the arterial vasculature, including coronary arteries and aortic valve. Heart size normal. No pericardial effusion.  Mediastinum/Nodes: No pathologically enlarged mediastinal, hilar or axillary lymph nodes. Esophagus is grossly unremarkable.  Lungs/Pleura: Severe bullous emphysema. Pleuroparenchymal scarring in the right upper lobe. Mild pleuroparenchymal scarring in the peripheral left upper lobe. No pleural fluid. Airway is unremarkable.  Musculoskeletal: Degenerative changes in the spine. No worrisome lytic or sclerotic lesions. T5 compression deformity is unchanged.  CT ABDOMEN PELVIS FINDINGS  Hepatobiliary: Liver and gallbladder are unremarkable. No biliary ductal dilatation.  Pancreas: Scattered calcifications in the pancreas.  Spleen: Negative.  Adrenals/Urinary Tract: Adrenal glands and kidneys are unremarkable. Ureters are decompressed. Bladder is grossly unremarkable.  Stomach/Bowel: Stomach, small  bowel, appendix and colon are unremarkable.  Vascular/Lymphatic: Atherosclerotic calcification of the arterial vasculature without abdominal aortic aneurysm. No pathologically enlarged lymph nodes.  Reproductive: Prostatectomy.  Penile prosthesis is in place.  Other: A small fluid density lesion is seen along of the right hepatic lobe the posterior margin, unchanged and likely benign. No free fluid. Mesenteries and peritoneum are unremarkable. Small right inguinal hernia contains fat.  Musculoskeletal: No worrisome lytic or sclerotic lesions. Degenerative changes in the spine.  IMPRESSION: 1. No evidence of metastatic disease. 2. Aortic atherosclerosis (ICD10-170.0). Coronary artery calcification. 3.  Emphysema (ICD10-J43.9). 4. Chronic calcific pancreatitis.     Impression and Plan:  84 year old man with the:  1.  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  He completed definitive radiation therapy with weekly chemotherapy concluded in June 2017.  He remains on active surveillance and he is close to 2 years out from the conclusion of therapy.  CT scan obtained on 08/10/2017 was personally reviewed and showed no evidence of recurrent disease.  The natural course of this disease was reviewed today as well as his future risk of relapse.  I recommended continued active surveillance at this time and repeat his imaging studies annually.  2.  Hip pain: Unclear etiology and I doubt malignancy at this time.  His imaging studies that was done on 08/10/2017 did not show any abnormalities.  He also had MRI as well as plain film x-rays done at his local hospital that did not show any malignancy.  His bone scan is currently pending and I have asked him to forward me the results of his future bone scan to ensure no malignancy..  3. Renal insufficiency: His kidney function remains stable with creatinine close to baseline.  4.  Mild anemia: Appears to be  multifactorial nature and related to renal insufficiency.  5. Follow-up: In 12 months for repeat imaging studies.  This will be done sooner if imaging studies of his bone show any malignancy.  15  minutes was spent with the patient face-to-face today.  More than 50% of time was dedicated to patient counseling, education and coordination of his care.    Zola Button, MD 6/12/20199:59 AM

## 2017-08-11 NOTE — Telephone Encounter (Signed)
Appointments scheduled AVS/Calendar printed per 6/12 los °

## 2017-08-12 ENCOUNTER — Ambulatory Visit: Payer: Medicare Other | Admitting: Oncology

## 2018-03-18 ENCOUNTER — Telehealth: Payer: Self-pay | Admitting: Oncology

## 2018-03-18 NOTE — Telephone Encounter (Signed)
Patient came in to reschedule lab appt closer to doc visit on a different day, since he stays so far away. He stated he rather have the lab and Ct scan the day before the doc visit.  Patient stated he will get contrast the day he comes in for lab.   MD was okay with me moving the lab appt.  Printed calendar.

## 2018-08-12 ENCOUNTER — Other Ambulatory Visit: Payer: Medicare Other

## 2018-08-18 ENCOUNTER — Inpatient Hospital Stay: Payer: Medicare Other | Attending: Oncology

## 2018-08-18 ENCOUNTER — Ambulatory Visit (HOSPITAL_COMMUNITY)
Admission: RE | Admit: 2018-08-18 | Discharge: 2018-08-18 | Disposition: A | Discharge status: Home / Self Care | Payer: Medicare Other | Source: Ambulatory Visit | Attending: Oncology | Admitting: Oncology

## 2018-08-18 ENCOUNTER — Other Ambulatory Visit: Payer: Self-pay

## 2018-08-18 DIAGNOSIS — N182 Chronic kidney disease, stage 2 (mild): Secondary | ICD-10-CM | POA: Diagnosis not present

## 2018-08-18 DIAGNOSIS — Z79899 Other long term (current) drug therapy: Secondary | ICD-10-CM | POA: Insufficient documentation

## 2018-08-18 DIAGNOSIS — C678 Malignant neoplasm of overlapping sites of bladder: Secondary | ICD-10-CM | POA: Diagnosis not present

## 2018-08-18 DIAGNOSIS — J439 Emphysema, unspecified: Secondary | ICD-10-CM | POA: Insufficient documentation

## 2018-08-18 DIAGNOSIS — D649 Anemia, unspecified: Secondary | ICD-10-CM | POA: Diagnosis not present

## 2018-08-18 DIAGNOSIS — I700 Atherosclerosis of aorta: Secondary | ICD-10-CM | POA: Diagnosis not present

## 2018-08-18 DIAGNOSIS — R634 Abnormal weight loss: Secondary | ICD-10-CM | POA: Insufficient documentation

## 2018-08-18 DIAGNOSIS — K861 Other chronic pancreatitis: Secondary | ICD-10-CM | POA: Diagnosis not present

## 2018-08-18 DIAGNOSIS — Z7982 Long term (current) use of aspirin: Secondary | ICD-10-CM | POA: Insufficient documentation

## 2018-08-18 DIAGNOSIS — R5383 Other fatigue: Secondary | ICD-10-CM | POA: Diagnosis not present

## 2018-08-18 LAB — CMP (CANCER CENTER ONLY)
ALT: 19 U/L (ref 0–44)
AST: 27 U/L (ref 15–41)
Albumin: 3.6 g/dL (ref 3.5–5.0)
Alkaline Phosphatase: 69 U/L (ref 38–126)
Anion gap: 10 (ref 5–15)
BUN: 16 mg/dL (ref 8–23)
CO2: 28 mmol/L (ref 22–32)
Calcium: 8.9 mg/dL (ref 8.9–10.3)
Chloride: 100 mmol/L (ref 98–111)
Creatinine: 1.22 mg/dL (ref 0.61–1.24)
GFR, Est AFR Am: >60 mL/min (ref >60)
GFR, Estimated: 53 mL/min — ABNORMAL LOW (ref >60)
Glucose, Bld: 98 mg/dL (ref 70–99)
Potassium: 3.9 mmol/L (ref 3.5–5.1)
Sodium: 138 mmol/L (ref 135–145)
Total Bilirubin: 0.4 mg/dL (ref 0.3–1.2)
Total Protein: 6.9 g/dL (ref 6.5–8.1)

## 2018-08-18 LAB — CBC WITH DIFFERENTIAL (CANCER CENTER ONLY)
Abs Immature Granulocytes: 0.03 10*3/uL (ref 0.00–0.07)
Basophils Absolute: 0 10*3/uL (ref 0.0–0.1)
Basophils Relative: 0 %
Eosinophils Absolute: 0.2 10*3/uL (ref 0.0–0.5)
Eosinophils Relative: 2 %
HCT: 32.1 % — ABNORMAL LOW (ref 39.0–52.0)
Hemoglobin: 10.7 g/dL — ABNORMAL LOW (ref 13.0–17.0)
Immature Granulocytes: 0 %
Lymphocytes Relative: 21 %
Lymphs Abs: 1.8 10*3/uL (ref 0.7–4.0)
MCH: 32.7 pg (ref 26.0–34.0)
MCHC: 33.3 g/dL (ref 30.0–36.0)
MCV: 98.2 fL (ref 80.0–100.0)
Monocytes Absolute: 1 10*3/uL (ref 0.1–1.0)
Monocytes Relative: 12 %
Neutro Abs: 5.5 10*3/uL (ref 1.7–7.7)
Neutrophils Relative %: 65 %
Platelet Count: 290 10*3/uL (ref 150–400)
RBC: 3.27 MIL/uL — ABNORMAL LOW (ref 4.22–5.81)
RDW: 13.8 % (ref 11.5–15.5)
WBC Count: 8.6 10*3/uL (ref 4.0–10.5)
nRBC: 0 % (ref 0.0–0.2)

## 2018-08-19 ENCOUNTER — Other Ambulatory Visit: Payer: Self-pay

## 2018-08-19 ENCOUNTER — Inpatient Hospital Stay (HOSPITAL_BASED_OUTPATIENT_CLINIC_OR_DEPARTMENT_OTHER): Payer: Medicare Other | Admitting: Oncology

## 2018-08-19 ENCOUNTER — Telehealth: Payer: Self-pay | Admitting: Oncology

## 2018-08-19 VITALS — BP 116/77 | HR 95 | Temp 98.9°F | Resp 18 | Ht 73.0 in | Wt 155.4 lb

## 2018-08-19 DIAGNOSIS — D649 Anemia, unspecified: Secondary | ICD-10-CM | POA: Diagnosis not present

## 2018-08-19 DIAGNOSIS — Z79899 Other long term (current) drug therapy: Secondary | ICD-10-CM

## 2018-08-19 DIAGNOSIS — Z7982 Long term (current) use of aspirin: Secondary | ICD-10-CM

## 2018-08-19 DIAGNOSIS — K861 Other chronic pancreatitis: Secondary | ICD-10-CM

## 2018-08-19 DIAGNOSIS — N182 Chronic kidney disease, stage 2 (mild): Secondary | ICD-10-CM | POA: Diagnosis not present

## 2018-08-19 DIAGNOSIS — C678 Malignant neoplasm of overlapping sites of bladder: Secondary | ICD-10-CM | POA: Diagnosis not present

## 2018-08-19 DIAGNOSIS — I700 Atherosclerosis of aorta: Secondary | ICD-10-CM

## 2018-08-19 DIAGNOSIS — R634 Abnormal weight loss: Secondary | ICD-10-CM

## 2018-08-19 DIAGNOSIS — R5383 Other fatigue: Secondary | ICD-10-CM

## 2018-08-19 DIAGNOSIS — J439 Emphysema, unspecified: Secondary | ICD-10-CM

## 2018-08-19 NOTE — Progress Notes (Signed)
Hematology and Oncology Follow Up Visit  Fred Newton [id number] [birth year] 85 y.o. 08/19/2018 12:27 PM Haze Rushing, MDVyas, Pankaj, MD   Principle Diagnosis: 85 year old man with bladder cancer diagnosed in May 2017.  He presented with T2N0 transitional cell carcinoma.    Prior Therapy: He is S/P TURBT in March 2017 with pathology showed high-grade invasive urothelial carcinoma with invasion into the muscularis propria. Definitive radiation therapy with weekly carboplatin started on 07/18/2015. He status post 6 weeks of therapy concluded on 08/22/2015.  Current therapy: Active surveillance.  Interim History: Fred Newton returns today for a repeat evaluation.  Since the last visit, he reports no major changes in his health.  He does report some occasional fatigue and tiredness but still able to function well and continues to drive.  He has lost some weight but his appetite remained reasonable.  He denies respiratory complaints or worsening shortness of breath.  He does report some mild edema in his lower extremity that has improved.   Patient denied any alteration mental status, neuropathy, confusion or dizziness.  Denies any headaches or lethargy.  Denies any night sweats, weight loss or changes in appetite.  Denied orthopnea, dyspnea on exertion or chest discomfort.  Denies shortness of breath, difficulty breathing hemoptysis or cough.  Denies any abdominal distention, nausea, early satiety or dyspepsia.  Denies any hematuria, frequency, dysuria or nocturia.  Denies any skin irritation, dryness or rash.  Denies any ecchymosis or petechiae.  Denies any lymphadenopathy or clotting.  Denies any heat or cold intolerance.  Denies any anxiety or depression.  Remaining review of system is negative.     Medications: I have reviewed the patient's current medications.  Current Outpatient Medications  Medication Sig Dispense Refill  . albuterol (PROVENTIL HFA;VENTOLIN HFA) 108 (90 BASE) MCG/ACT  inhaler Inhale 2 puffs into the lungs every 6 (six) hours as needed for wheezing or shortness of breath.    Marland Kitchen aspirin EC 81 MG tablet Take 1 tablet (81 mg total) by mouth every morning.    . Calcium Carbonate-Vitamin D (CALCIUM 600+D) 600-400 MG-UNIT tablet Take 1 tablet by mouth daily.    . camphor-menthol (SARNA) lotion Apply 1 application topically 2 (two) times daily as needed for itching.    . cetirizine (ZYRTEC) 10 MG tablet Take 10 mg by mouth every morning.     . cycloSPORINE (RESTASIS) 0.05 % ophthalmic emulsion Place 1 drop into both eyes 2 (two) times daily as needed (Dry eyes).     Marland Kitchen docusate sodium (COLACE) 100 MG capsule Take 1 capsule (100 mg total) by mouth 2 (two) times daily. 30 capsule 0  . fludrocortisone (FLORINEF) 0.1 MG tablet Take 0.1 mg by mouth every other day.    . fluticasone (FLONASE) 50 MCG/ACT nasal spray Place 1 spray into both nostrils daily as needed for allergies or rhinitis.    . Fluticasone-Salmeterol (ADVAIR) 250-50 MCG/DOSE AEPB Inhale 1 puff into the lungs 2 (two) times daily.    . Multiple Vitamin (MULTIVITAMIN WITH MINERALS) TABS tablet Take 1 tablet by mouth every morning.     . Omega-3 Fatty Acids (FISH OIL) 1000 MG CAPS Take 1 capsule (1,000 mg total) by mouth 2 (two) times daily.  0  . oxyCODONE-acetaminophen (PERCOCET) 5-325 MG tablet Take 1-2 tablets by mouth every 4 (four) hours as needed for severe pain. 20 tablet 0  . OXYGEN Inhale into the lungs. 2 LITERS AT NIGHT    . pantoprazole (PROTONIX) 40 MG tablet Take 40 mg by  mouth daily.    . Psyllium (METAMUCIL) WAFR Take 2 Wafers by mouth daily.     . simvastatin (ZOCOR) 40 MG tablet Take 40 mg by mouth every evening.     . tamsulosin (FLOMAX) 0.4 MG CAPS capsule Take 0.4 mg by mouth every evening.     . tiotropium (SPIRIVA) 18 MCG inhalation capsule Place 18 mcg into inhaler and inhale daily.     No current facility-administered medications for this visit.    Facility-Administered Medications  Ordered in Other Visits  Medication Dose Route Frequency Provider Last Rate Last Dose  . epirubicin (ELLENCE) 50 mg in sodium chloride 0.9 % bladder instillation  50 mg Bladder Instillation Once Festus Aloe, MD      . epirubicin (ELLENCE) 80 mg in sodium chloride 0.9 % bladder instillation  80 mg Bladder Instillation Once Festus Aloe, MD         Allergies:  Allergies  Allergen Reactions  . Hibiclens [Chlorhexidine] Itching, Rash and Other (See Comments)    Patient stated,"it burns my skin."  . Chlorhexidine Gluconate Itching    Excessive skin dryness and skin redness, pt wishes not to use  . Nitrofuran Derivatives Other (See Comments)    Unknown    Past Medical History, Surgical history, Social history, and Family History were reviewed and updated.    Physical Exam: Blood pressure 116/77, pulse 95, temperature 98.9 F (37.2 C), temperature source Oral, resp. rate 18, height 6\' 1"  (1.854 m), weight 155 lb 6.4 oz (70.5 kg), SpO2 95 %.   ECOG: 0   General appearance: Comfortable appearing without any discomfort Head: Normocephalic without any trauma Oropharynx: Mucous membranes are moist and pink without any thrush or ulcers. Eyes: Pupils are equal and round reactive to light. Lymph nodes: No cervical, supraclavicular, inguinal or axillary lymphadenopathy.   Heart:regular rate and rhythm.  S1 and S2 without leg edema. Lung: Clear without any rhonchi or wheezes.  No dullness to percussion. Abdomin: Soft, nontender, nondistended with good bowel sounds.  No hepatosplenomegaly. Musculoskeletal: No joint deformity or effusion.  Full range of motion noted. Neurological: No deficits noted on motor, sensory and deep tendon reflex exam. Skin: No petechial rash or dryness.  Appeared moist.     Lab Results: Lab Results  Component Value Date   WBC 8.6 08/18/2018   HGB 10.7 (L) 08/18/2018   HCT 32.1 (L) 08/18/2018   MCV 98.2 08/18/2018   PLT 290 08/18/2018      Chemistry      Component Value Date/Time   NA 138 08/18/2018 1322   NA 139 12/10/2016 1308   K 3.9 08/18/2018 1322   K 4.8 12/10/2016 1308   CL 100 08/18/2018 1322   CO2 28 08/18/2018 1322   CO2 25 12/10/2016 1308   BUN 16 08/18/2018 1322   BUN 31.0 (H) 12/10/2016 1308   CREATININE 1.22 08/18/2018 1322   CREATININE 1.4 (H) 12/10/2016 1308      Component Value Date/Time   CALCIUM 8.9 08/18/2018 1322   CALCIUM 9.5 12/10/2016 1308   ALKPHOS 69 08/18/2018 1322   ALKPHOS 52 12/10/2016 1308   AST 27 08/18/2018 1322   AST 25 12/10/2016 1308   ALT 19 08/18/2018 1322   ALT 16 12/10/2016 1308   BILITOT 0.4 08/18/2018 1322   BILITOT 0.48 12/10/2016 1308      EXAM: CT ABDOMEN AND PELVIS WITHOUT CONTRAST  TECHNIQUE: Multidetector CT imaging of the abdomen and pelvis was performed following the standard protocol without IV contrast.  COMPARISON:  08/10/2017  FINDINGS: Lower chest: Aortic valve calcification again noted. Coronary artery calcification is evident. Marked emphysematous changes noted in the lower lungs.  Hepatobiliary: No focal abnormality in the liver on this study without intravenous contrast. There is no evidence for gallstones, gallbladder wall thickening, or pericholecystic fluid. No intrahepatic or extrahepatic biliary dilation.  Pancreas: Diffuse parenchymal atrophy with dystrophic parenchymal calcification, features compatible with chronic pancreatitis. No dilatation of the main duct.  Spleen: No splenomegaly. No focal mass lesion.  Adrenals/Urinary Tract: No adrenal nodule or mass. Kidneys unremarkable. No evidence for hydroureter. The urinary bladder appears normal for the degree of distention.  Stomach/Bowel: Stomach is unremarkable. No gastric wall thickening. No evidence of outlet obstruction. Duodenum is normally positioned as is the ligament of Treitz. No small bowel wall thickening. No small bowel dilatation. The terminal ileum is  normal. The appendix is normal. No gross colonic mass. No colonic wall thickening.  Vascular/Lymphatic: There is abdominal aortic atherosclerosis without aneurysm. Infrarenal abdominal aorta measures up to 3.0 cm diameter. There is no gastrohepatic or hepatoduodenal ligament lymphadenopathy. No intraperitoneal or retroperitoneal lymphadenopathy. No pelvic sidewall lymphadenopathy.  Reproductive: The prostate gland and seminal vesicles are unremarkable. Penile prosthetic device evident.  Other: No intraperitoneal free fluid.  Musculoskeletal: Small bilateral groin hernias contain only fat No worrisome lytic or sclerotic osseous abnormality. L5 compression fracture is new in the interval. Bilateral sacral insufficiency fractures are new since prior study.  IMPRESSION: 1. No evidence for metastatic disease in the abdomen/pelvis. 2. Sequelae of chronic pancreatitis. 3. Interval development of L5 compression deformity and bilateral sacral insufficiency fractures. 4. Infrarenal abdominal aorta measures up to 3.0 cm diameter. Recommend followup by ultrasound in 3 years. This recommendation follows ACR consensus guidelines: White Paper of the ACR Incidental Findings Committee II on Vascular Findings. J Am Coll Radiol 2013; 27:517-001 5. Aortic Atherosclerois (ICD10-170.0) 6.  Emphysema. (VCB44-H67.9)     Impression and Plan:  85 year old man with the:  1.  Bladder cancer diagnosed in 2017.  He was found to have T2N0 transitional cell carcinoma and received definitive therapy with radiation and weekly chemotherapy.  He is currently on active surveillance without any evidence of relapsed disease at this time.  CT scan on 08/18/2018 was personally reviewed and showed no sign of disease relapse.  The natural course of this disease and risk of relapse was assessed.  At this time he is 3 years out from his treatment and likelihood of disease relapse is low but still exists.  After  discussion today, he is agreeable to continue with active surveillance and repeat scan one more time.  He will return next year for a repeat imaging studies.   2.  Anemia: Appears to be related to chronic disease and malignancy.  He has a mild chronic renal insufficiency as well.  Hemoglobin remained stable.  3. Follow-up: We will be in 1 year for repeat evaluation.  15  minutes was spent with the patient face-to-face today.  More than 50% of time was spent on reviewing his disease status, imaging studies, laboratory testing and discussing future plan of care.    Zola Button, MD 6/19/202012:27 PM

## 2018-08-19 NOTE — Telephone Encounter (Signed)
Scheduled per los. Gave avs and calendar  

## 2019-08-17 ENCOUNTER — Inpatient Hospital Stay: Payer: Medicare Other | Attending: Oncology

## 2019-08-24 ENCOUNTER — Inpatient Hospital Stay: Payer: Medicare Other | Admitting: Oncology

## 2020-01-01 DEATH — deceased

## 2021-05-05 IMAGING — CT CT ABDOMEN AND PELVIS WITHOUT CONTRAST
2 of 4 series · 16 of 46 positions shown, 18 images · non-contrast
Comparison: 08/10/2017

CLINICAL DATA: Bladder cancer.

EXAM:
CT ABDOMEN AND PELVIS WITHOUT CONTRAST
TECHNIQUE: Multidetector CT imaging of the abdomen and pelvis was performed
following the standard protocol without IV contrast.

[Series 2: axial st · axial · 0.96mm/px · z∈[+1240,+1636]mm · 13 of 91 slices shown, 15 images]
[im 6/91  soft-tissue]
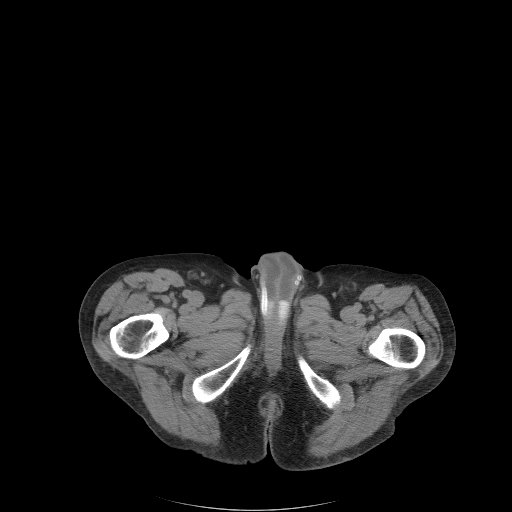
[im 6/91  bone]
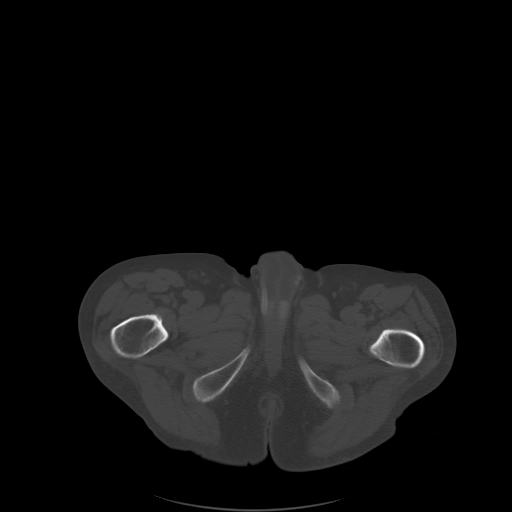
[im 11/91  soft-tissue]
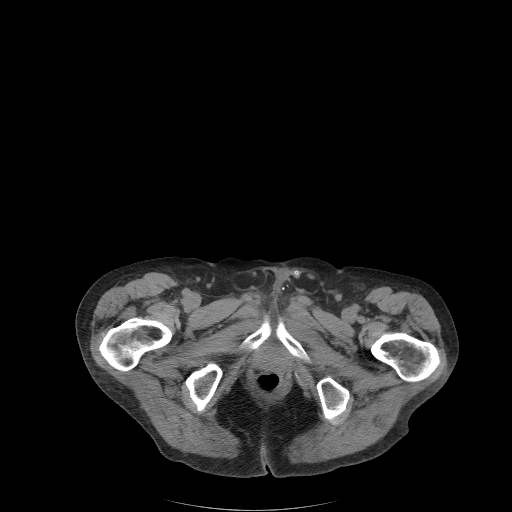
[im 22/91  soft-tissue]
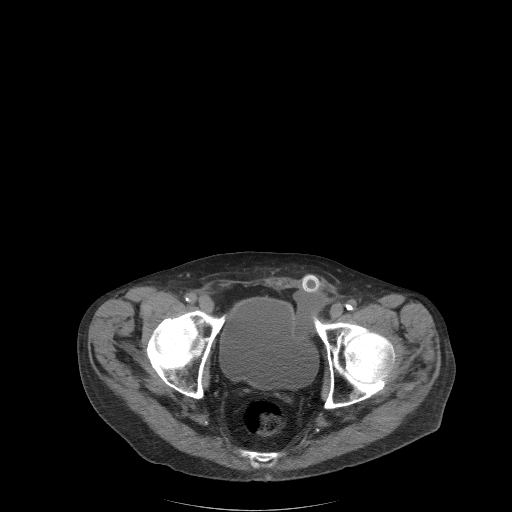
[im 27/91  soft-tissue]
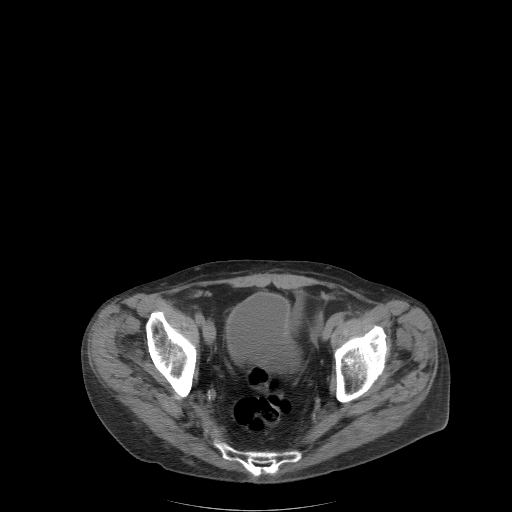
[im 32/91  soft-tissue]
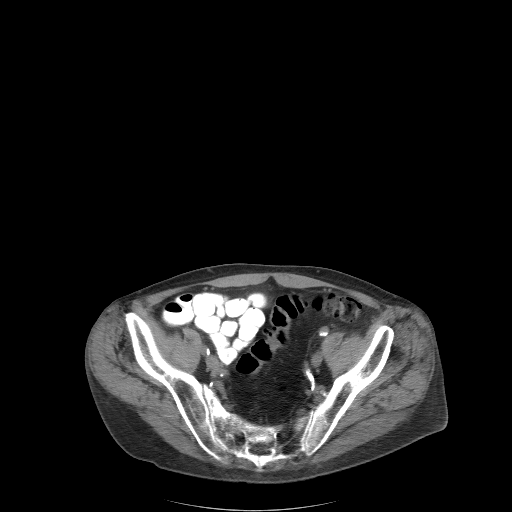
[im 38/91  soft-tissue]
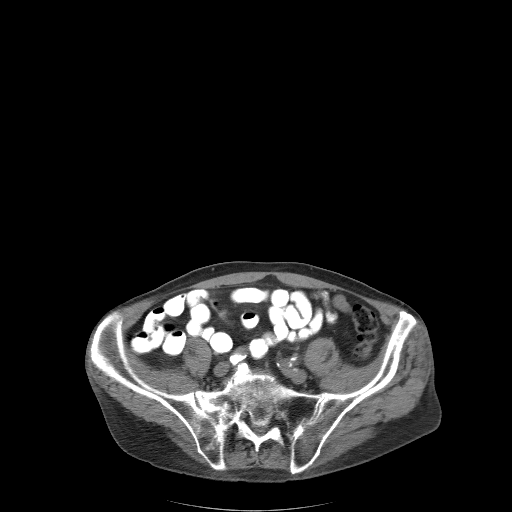
[im 48/91  soft-tissue]
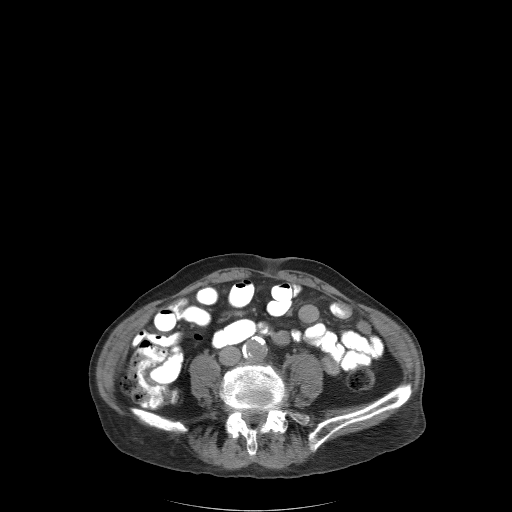
[im 53/91  soft-tissue]
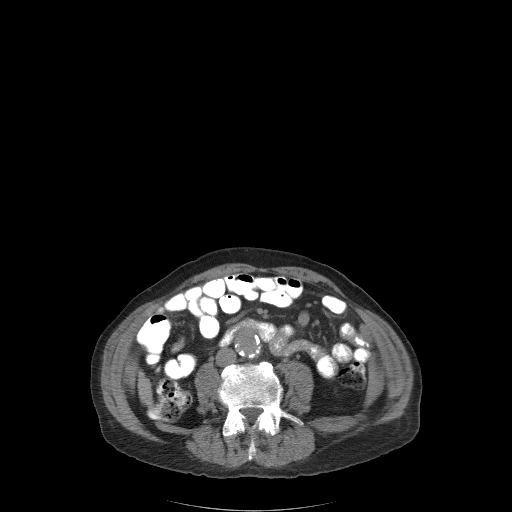
[im 59/91  soft-tissue]
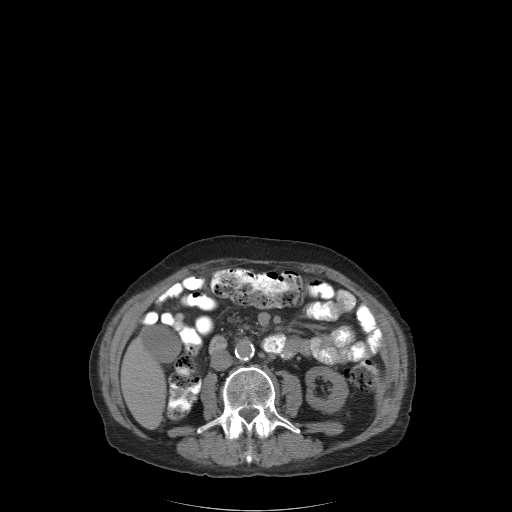
[im 59/91  bone]
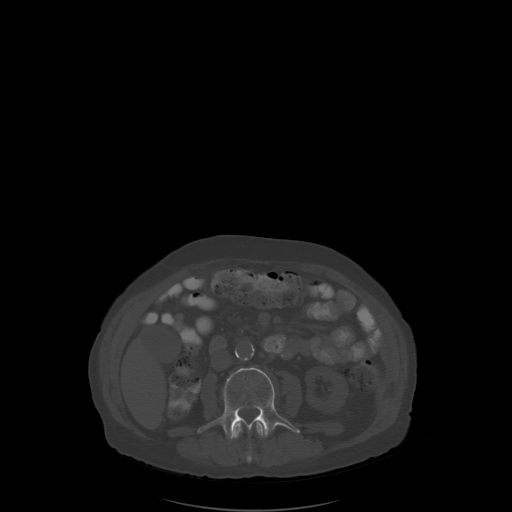
[im 64/91  soft-tissue]
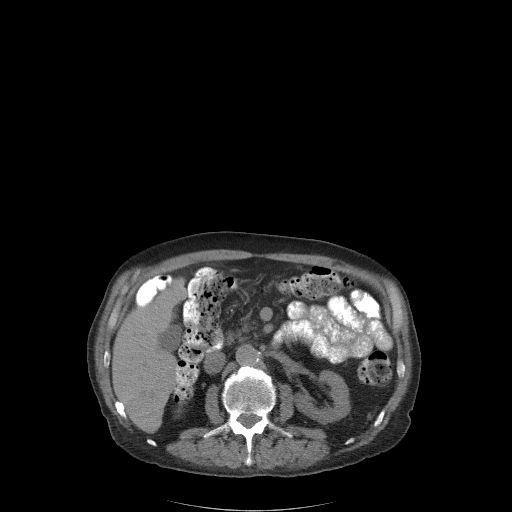
[im 69/91  soft-tissue]
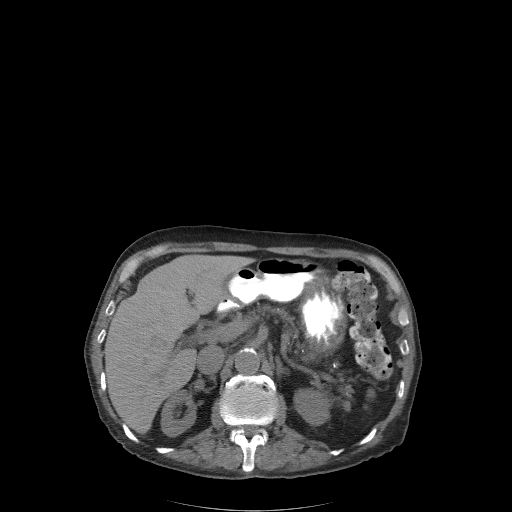
[im 80/91  soft-tissue]
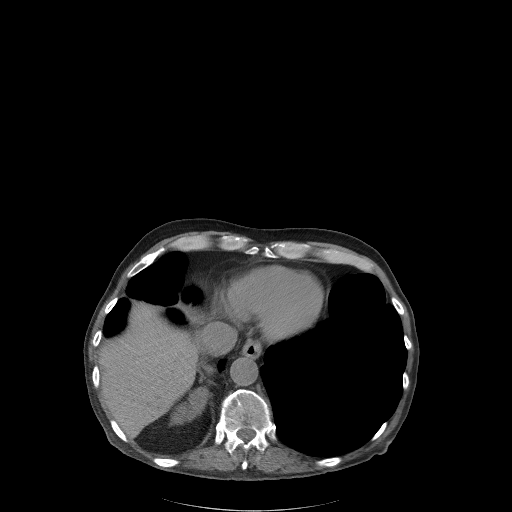
[im 85/91  soft-tissue]
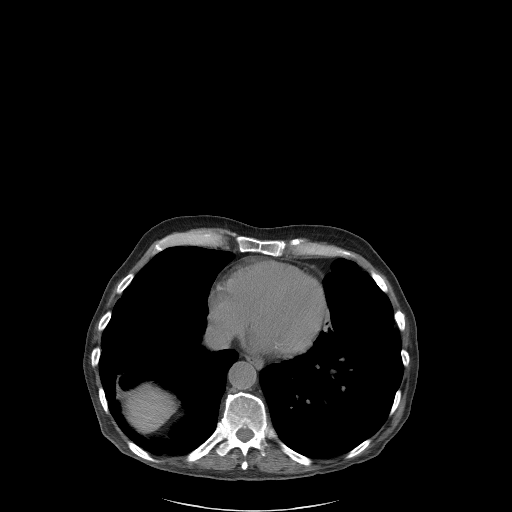

[Series 4: coronal st · coronal · 0.98mm/px · 3 of 79 slices shown]
[im 27/79  soft-tissue]
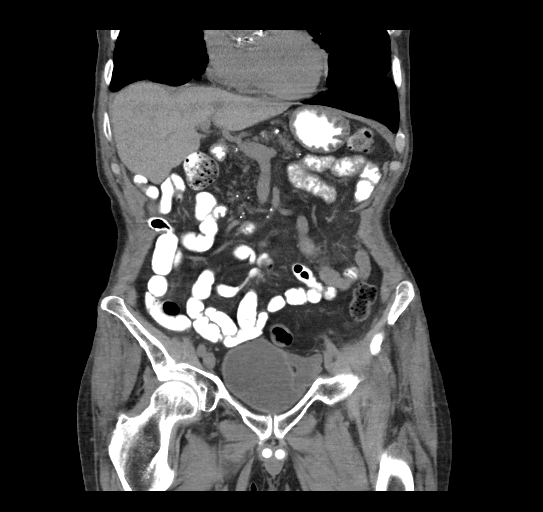
[im 35/79  soft-tissue]
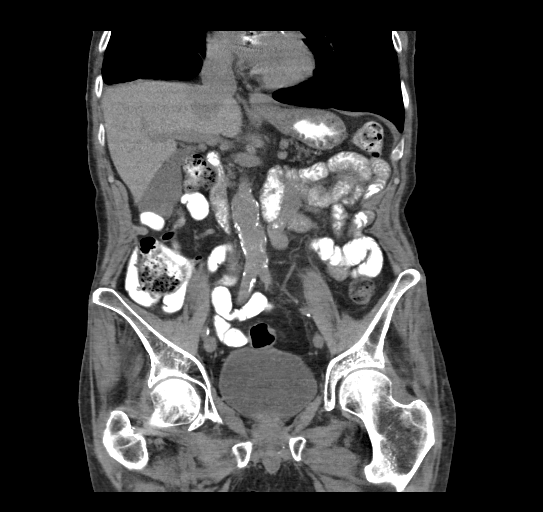
[im 44/79  soft-tissue]
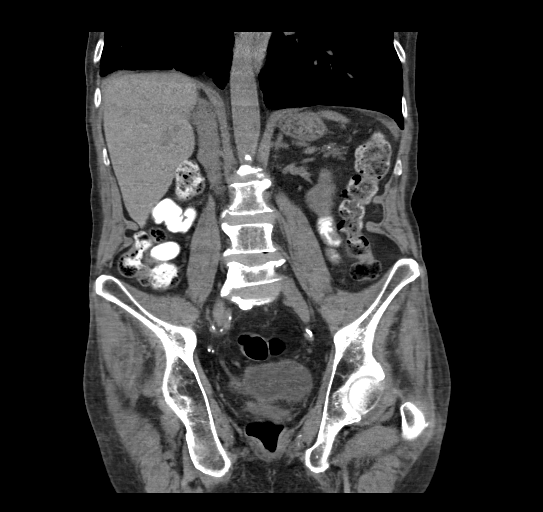

[16 of 46 positions shown; findings below may reference images not displayed]

FINDINGS: Lower chest: Aortic valve calcification again noted. Coronary artery
calcification is evident. Marked emphysematous changes noted in the
lower lungs.

Hepatobiliary: No focal abnormality in the liver on this study
without intravenous contrast. There is no evidence for gallstones,
gallbladder wall thickening, or pericholecystic fluid. No
intrahepatic or extrahepatic biliary dilation.

Pancreas: Diffuse parenchymal atrophy with dystrophic parenchymal
calcification, features compatible with chronic pancreatitis. No
dilatation of the main duct.

Spleen: No splenomegaly. No focal mass lesion.

Adrenals/Urinary Tract: No adrenal nodule or mass. Kidneys
unremarkable. No evidence for hydroureter. The urinary bladder
appears normal for the degree of distention.

Stomach/Bowel: Stomach is unremarkable. No gastric wall thickening.
No evidence of outlet obstruction. Duodenum is normally positioned
as is the ligament of Treitz. No small bowel wall thickening. No
small bowel dilatation. The terminal ileum is normal. The appendix
is normal. No gross colonic mass. No colonic wall thickening.

Vascular/Lymphatic: There is abdominal aortic atherosclerosis
without aneurysm. Infrarenal abdominal aorta measures up to 3.0 cm
diameter. There is no gastrohepatic or hepatoduodenal ligament
lymphadenopathy. No intraperitoneal or retroperitoneal
lymphadenopathy. No pelvic sidewall lymphadenopathy.

Reproductive: The prostate gland and seminal vesicles are
unremarkable. Penile prosthetic device evident.

Other: No intraperitoneal free fluid.

Musculoskeletal: Small bilateral groin hernias contain only fat No
worrisome lytic or sclerotic osseous abnormality. L5 compression
fracture is new in the interval. Bilateral sacral insufficiency
fractures are new since prior study.
IMPRESSION: 1. No evidence for metastatic disease in the abdomen/pelvis.
2. Sequelae of chronic pancreatitis.
3. Interval development of L5 compression deformity and bilateral
sacral insufficiency fractures.
4. Infrarenal abdominal aorta measures up to 3.0 cm diameter.
Recommend followup by ultrasound in 3 years. This recommendation
follows ACR consensus guidelines: White Paper of the ACR Incidental
Findings Committee II on Vascular Findings. [HOSPITAL] 2216;
5. Aortic Atherosclerois (DFB6P-170.0)
6.  Emphysema. (DFB6P-4SC.S)
